# Patient Record
Sex: Female | Born: 1965 | Race: Black or African American | Hispanic: No | Marital: Single | Smoking: Current every day smoker
Health system: Southern US, Community
[De-identification: ages and names within clinical notes are randomized; demographics above are authoritative.]

## PROBLEM LIST (undated history)

## (undated) DIAGNOSIS — R011 Cardiac murmur, unspecified: Secondary | ICD-10-CM

## (undated) DIAGNOSIS — I480 Paroxysmal atrial fibrillation: Secondary | ICD-10-CM

## (undated) DIAGNOSIS — E785 Hyperlipidemia, unspecified: Secondary | ICD-10-CM

## (undated) DIAGNOSIS — I5030 Unspecified diastolic (congestive) heart failure: Secondary | ICD-10-CM

## (undated) DIAGNOSIS — J45909 Unspecified asthma, uncomplicated: Secondary | ICD-10-CM

## (undated) HISTORY — DX: Unspecified asthma, uncomplicated: J45.909

## (undated) HISTORY — DX: Hyperlipidemia, unspecified: E78.5

## (undated) HISTORY — PX: PARTIAL HYSTERECTOMY: SHX80

## (undated) HISTORY — PX: CYST EXCISION: SHX5701

## (undated) HISTORY — DX: Cardiac murmur, unspecified: R01.1

## (undated) HISTORY — DX: Paroxysmal atrial fibrillation: I48.0

## (undated) HISTORY — DX: Unspecified diastolic (congestive) heart failure: I50.30

---

## 2016-03-14 ENCOUNTER — Encounter: Payer: Self-pay | Admitting: Emergency Medicine

## 2016-03-14 ENCOUNTER — Emergency Department: Payer: Self-pay

## 2016-03-14 ENCOUNTER — Emergency Department
Admission: EM | Admit: 2016-03-14 | Discharge: 2016-03-14 | Disposition: A | Discharge status: Home / Self Care | Payer: Self-pay | Attending: Emergency Medicine | Admitting: Emergency Medicine

## 2016-03-14 DIAGNOSIS — R079 Chest pain, unspecified: Secondary | ICD-10-CM | POA: Insufficient documentation

## 2016-03-14 DIAGNOSIS — R0602 Shortness of breath: Secondary | ICD-10-CM | POA: Insufficient documentation

## 2016-03-14 DIAGNOSIS — J45909 Unspecified asthma, uncomplicated: Secondary | ICD-10-CM | POA: Insufficient documentation

## 2016-03-14 DIAGNOSIS — F17210 Nicotine dependence, cigarettes, uncomplicated: Secondary | ICD-10-CM | POA: Insufficient documentation

## 2016-03-14 LAB — CBC
HCT: 45.3 % (ref 35.0–47.0)
HEMOGLOBIN: 15.8 g/dL (ref 12.0–16.0)
MCH: 30.7 pg (ref 26.0–34.0)
MCHC: 34.9 g/dL (ref 32.0–36.0)
MCV: 88 fL (ref 80.0–100.0)
Platelets: 251 10*3/uL (ref 150–440)
RBC: 5.14 MIL/uL (ref 3.80–5.20)
RDW: 14.4 % (ref 11.5–14.5)
WBC: 8.6 10*3/uL (ref 3.6–11.0)

## 2016-03-14 LAB — TROPONIN I: Troponin I: <0.03 ng/mL (ref <0.03)

## 2016-03-14 LAB — BASIC METABOLIC PANEL
ANION GAP: 7 (ref 5–15)
BUN: 10 mg/dL (ref 6–20)
CALCIUM: 9.1 mg/dL (ref 8.9–10.3)
CHLORIDE: 105 mmol/L (ref 101–111)
CO2: 25 mmol/L (ref 22–32)
Creatinine, Ser: 0.66 mg/dL (ref 0.44–1.00)
GFR calc Af Amer: >60 mL/min (ref >60)
GFR calc non Af Amer: >60 mL/min (ref >60)
GLUCOSE: 112 mg/dL — AB (ref 65–99)
Potassium: 3.6 mmol/L (ref 3.5–5.1)
Sodium: 137 mmol/L (ref 135–145)

## 2016-03-14 IMAGING — CR DG CHEST 2V
1 series · 2 of 2 positions shown · non-contrast
Comparison: No recent prior.

CLINICAL DATA: Left-sided chest pain.

EXAM:
CHEST  2 VIEW

[Series 1: dg chest 2 view · 0.14mm/px · 2 of 2 slices shown]
[im 1/2]
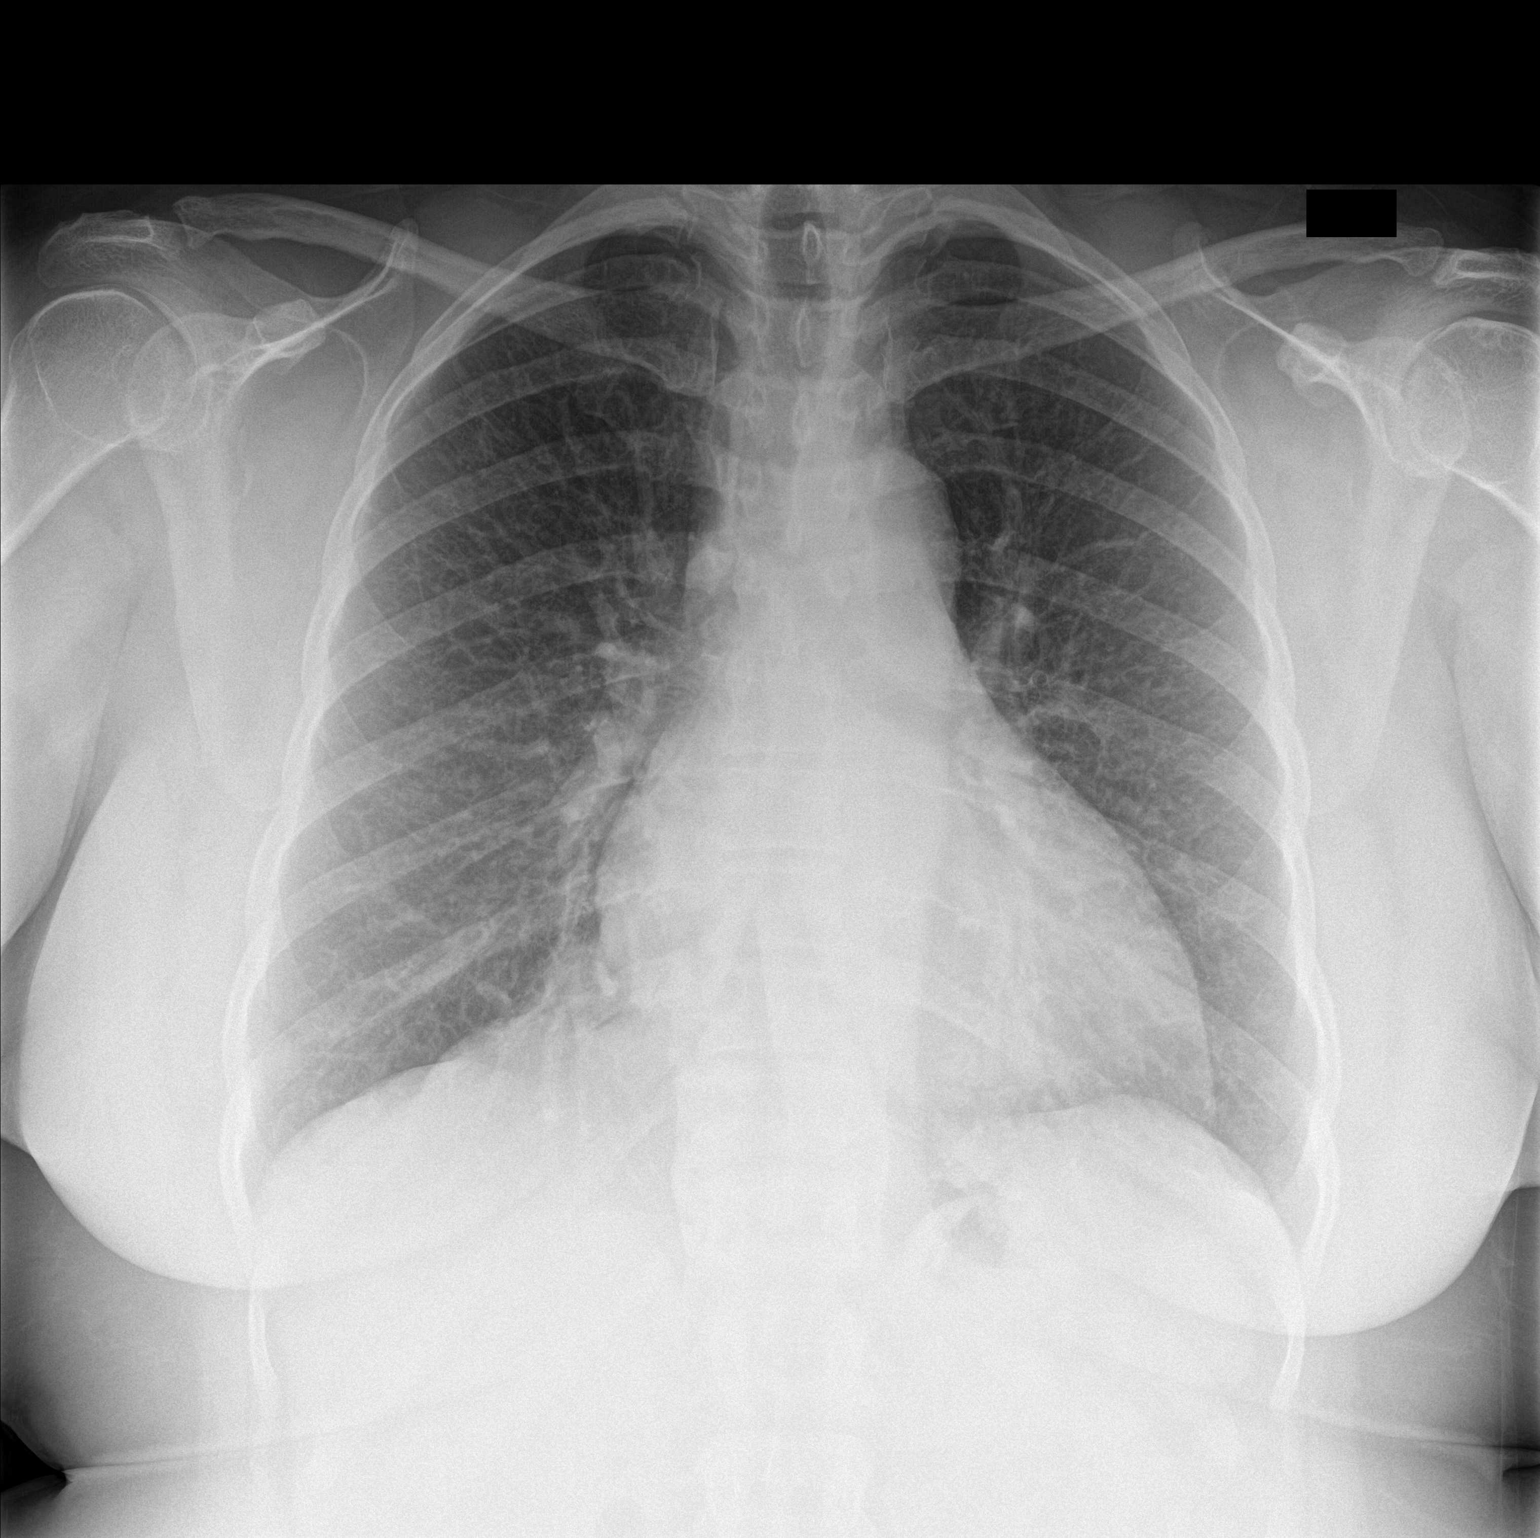
[im 2/2]
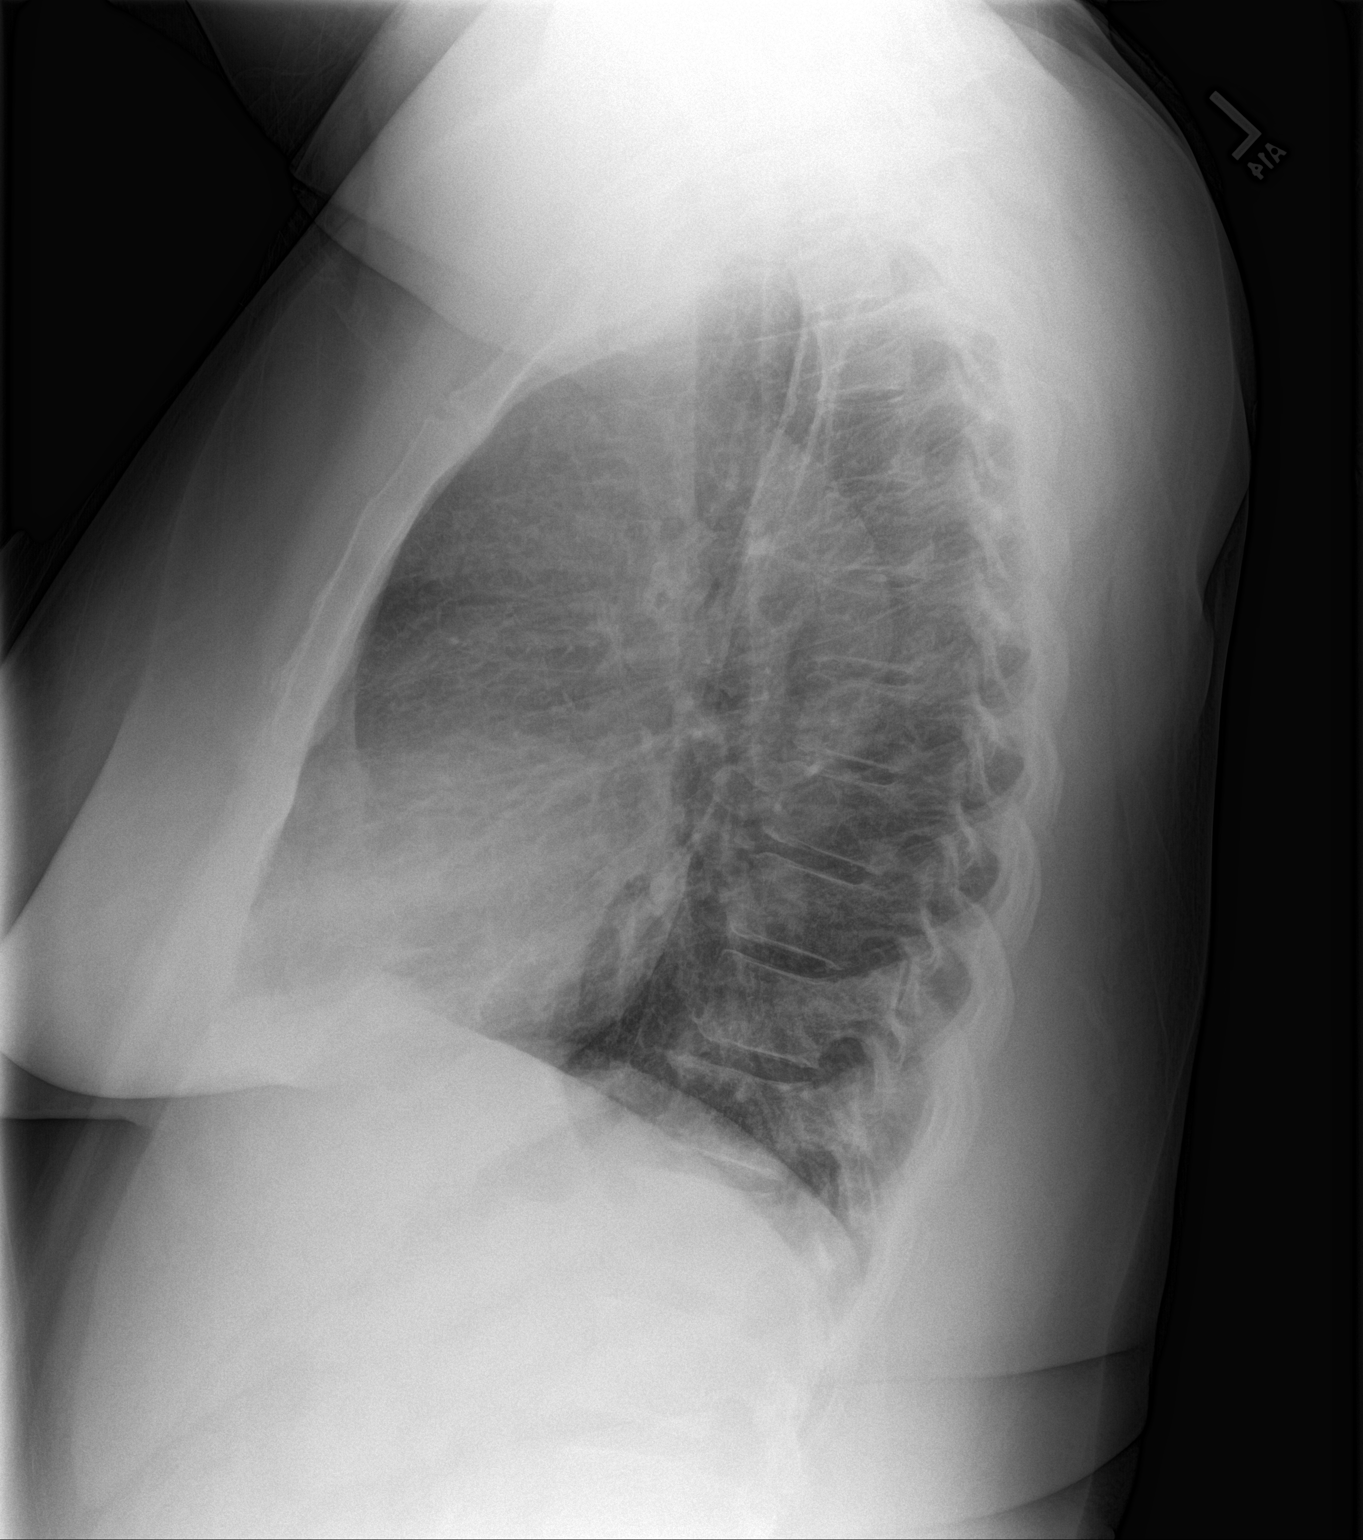

[2 of 2 positions shown; findings below may reference images not displayed]

FINDINGS: Mediastinum and hilar structures are normal. Mild left base
subsegmental atelectasis and or infiltrate.

Heart size normal.  No pleural effusion or pneumothorax.
IMPRESSION: Mild left base subsegmental atelectasis and or infiltrate.

## 2016-03-14 MED ORDER — ASPIRIN 81 MG PO CHEW
324.0000 mg | CHEWABLE_TABLET | Freq: Once | ORAL | Status: AC
  Administered 2016-03-14: 324 mg via ORAL
  Filled 2016-03-14: qty 4

## 2016-03-14 MED ORDER — ALBUTEROL SULFATE HFA 108 (90 BASE) MCG/ACT IN AERS: 2.0000 | INHALATION_SPRAY | Freq: Four times a day (QID) | RESPIRATORY_TRACT | 2 refills | Status: DC | PRN

## 2016-03-14 NOTE — ED Notes (Signed)
Pt resting in bed, watching TV, pt awake and alert in no acute distress

## 2016-03-14 NOTE — ED Notes (Signed)
EDP at bedside  

## 2016-03-14 NOTE — ED Triage Notes (Signed)
Pt to ed with c/o chest pain intermittently over the last 2 weeks.  Pt states sob, associated with chest pain, but denies n/v, denies weakness, denies dizziness.

## 2016-03-14 NOTE — Discharge Instructions (Signed)

## 2016-03-14 NOTE — ED Notes (Signed)
Patient was unhooked from monitor to ambulate to bathroom.

## 2016-03-14 NOTE — ED Provider Notes (Signed)
[facility] Emergency Department Provider Note  ____________________________________________  Time seen: Approximately 9:59 AM  I have reviewed the triage vital signs and the nursing notes.   HISTORY  Chief Complaint Chest Pain   HPI Rebekah Davenport is a 50 y.o. female with a history of a heart murmur, COPD, and current smoker who presents for evaluation of chest pain. Patient reports that she's been having intermittent episodes of chest pain every other day for the last 2 weeks. She reports that the pain is sharp, located in the left side of her chest, nonradiating, that can happened both at rest and exertion. She reports that the pain lasts 1 minute and resolves without intervention. Pain is associated with mild SOB. She denies fever, chills, cough, body aches, lightheadedness, nausea, vomiting, abdominal pain, palpitations. Patient denies personal or family history of ischemic heart disease. She reports that she hasn't seen a doctor in 15 years. Her last episode of pain was yesterday in church and she comes in today because she couldn't get appointment with her primary care doctor today. She has no pain at this time. Patient reports that she smokes one pack of cigarettes per month. No personal or family history of PE or DVT, the pain is not pleuritic, no leg pain or swelling, no hemoptysis, no history of exogenous hormones.  History reviewed. No pertinent past medical history.  There are no active problems to display for this patient.   History reviewed. No pertinent surgical history.  Prior to Admission medications   Not on File    Allergies Review of patient's allergies indicates no known allergies.  History reviewed. No pertinent family history.  Social History Social History  Substance Use Topics  . Smoking status: Current Every Day Smoker  . Smokeless tobacco: Never Used  . Alcohol use No    Review of Systems  Constitutional: Negative for  fever. Eyes: Negative for visual changes. ENT: Negative for sore throat. Cardiovascular: + chest pain. Respiratory: + shortness of breath. Gastrointestinal: Negative for abdominal pain, vomiting or diarrhea. Genitourinary: Negative for dysuria. Musculoskeletal: Negative for back pain. Skin: Negative for rash. Neurological: Negative for headaches, weakness or numbness.  ____________________________________________   PHYSICAL EXAM:  VITAL SIGNS: ED Triage Vitals  Enc Vitals Group     BP 03/14/16 0836 (!) 155/73     Pulse Rate 03/14/16 0836 64     Resp 03/14/16 0836 18     Temp 03/14/16 0836 97.9 F (36.6 C)     Temp Source 03/14/16 0836 Oral     SpO2 03/14/16 0836 100 %     Weight 03/14/16 0836 260 lb (117.9 kg)     Height 03/14/16 0836 5\' 5"  (1.651 m)     Head Circumference --      Peak Flow --      Pain Score 03/14/16 0837 3     Pain Loc --      Pain Edu? --      Excl. in GC? --     Constitutional: Alert and oriented. Well appearing and in no apparent distress. HEENT:      Head: Normocephalic and atraumatic.         Eyes: Conjunctivae are normal. Sclera is non-icteric. EOMI. PERRL      Mouth/Throat: Mucous membranes are moist.       Neck: Supple with no signs of meningismus. Cardiovascular: Regular rate and rhythm. No murmurs, gallops, or rubs. 2+ symmetrical distal pulses are present in all extremities. No  JVD. Respiratory: Normal respiratory effort. Lungs are clear to auscultation bilaterally. No wheezes, crackles, or rhonchi.  Gastrointestinal: Soft, non tender, and non distended with positive bowel sounds. No rebound or guarding. Genitourinary: No CVA tenderness. Musculoskeletal: Nontender with normal range of motion in all extremities. No edema, cyanosis, or erythema of extremities. Neurologic: Normal speech and language. Face is symmetric. Moving all extremities. No gross focal neurologic deficits are appreciated. Skin: Skin is warm, dry and intact. No rash  noted. Psychiatric: Mood and affect are normal. Speech and behavior are normal.  ____________________________________________   LABS (all labs ordered are listed, but only abnormal results are displayed)  Labs Reviewed  BASIC METABOLIC PANEL - Abnormal; Notable for the following:       Result Value   Glucose, Bld 112 (*)    All other components within normal limits  CBC  TROPONIN I  TROPONIN I   ____________________________________________  EKG  ED ECG REPORT I, Nita Sickle, the attending physician, personally viewed and interpreted this ECG.   Normal sinus rhythm, rate of 72, normal intervals, normal axis, no ST elevations or depressions. ____________________________________________  RADIOLOGY  CXR:  Mild left base subsegmental atelectasis and or infiltrate ____________________________________________   PROCEDURES  Procedure(s) performed: None Procedures Critical Care performed:  None ____________________________________________   INITIAL IMPRESSION / ASSESSMENT AND PLAN / ED COURSE   50 y.o. female with a history of a heart murmur, COPD, and current smoker who presents for evaluation of short lived, recurrent episodes of left-sided sharp chest pain. Episodes last less than 1 minute. Her last episode was yesterday while in church. Patient is asymptomatic at this time. EKG with no evidence of ischemia. First troponin is negative. Chest x-ray showing atelectasis and questionable infiltrate however patient has no signs or symptoms of pneumonia. Heart score of 2. Will give a full dose of aspirin, observe on telemetry, get 2 troponins every 3 hours. If everything is negative. We'll discharge patient with follow-up with cardiology for a stress test as outpatient.  Clinical Course  Comment By Time  Troponin 2 is negative. EKG nonischemic. Remaining of her workup essentially unremarkable. I have spoke with Dr. Kirke Corin, cardiology who will see patient this week in his  clinic for an outpatient stress test. I have discussed return precautions with patient if she has new or worsening chest pain prior to her appointment with cardiology and instructed her to return to the emergency room. Patient agrees with these recommendations. We'll discharge at this time. Nita Sickle, MD 09/18 1225    Pertinent labs & imaging results that were available during my care of the patient were reviewed by me and considered in my medical decision making (see chart for details).    ____________________________________________   FINAL CLINICAL IMPRESSION(S) / ED DIAGNOSES  Final diagnoses:  Chest pain, unspecified chest pain type      NEW MEDICATIONS STARTED DURING THIS VISIT:  New Prescriptions   No medications on file     Note:  This document was prepared using Dragon voice recognition software and may include unintentional dictation errors.    Nita Sickle, MD 03/14/16 1227

## 2016-03-16 ENCOUNTER — Ambulatory Visit (INDEPENDENT_AMBULATORY_CARE_PROVIDER_SITE_OTHER): Payer: Self-pay | Admitting: Internal Medicine

## 2016-03-16 ENCOUNTER — Encounter: Payer: Self-pay | Admitting: Internal Medicine

## 2016-03-16 VITALS — BP 140/76 | HR 61 | Ht 65.5 in | Wt 271.5 lb

## 2016-03-16 DIAGNOSIS — R079 Chest pain, unspecified: Secondary | ICD-10-CM

## 2016-03-16 MED ORDER — ACETAMINOPHEN 500 MG PO TABS: 500.0000 mg | ORAL_TABLET | Freq: Four times a day (QID) | ORAL | 0 refills | Status: DC | PRN

## 2016-03-16 NOTE — Patient Instructions (Addendum)
Medication Instructions:  Your physician has recommended you make the following change in your medication:  1. Start Aspirin 81 mg Once daily 2. Tylenol 500 mg every 6 hours as needed for pain   Testing/Procedures: [facility]  Your caregiver has ordered a Stress Test with nuclear imaging. The purpose of this test is to evaluate the blood supply to your heart muscle. This procedure is referred to as a "Non-Invasive Stress Test." This is because other than having an IV started in your vein, nothing is inserted or "invades" your body. Cardiac stress tests are done to find areas of poor blood flow to the heart by determining the extent of coronary artery disease (CAD). Some patients exercise on a treadmill, which naturally increases the blood flow to your heart, while others who are  unable to walk on a treadmill due to physical limitations have a pharmacologic/chemical stress agent called Lexiscan . This medicine will mimic walking on a treadmill by temporarily increasing your coronary blood flow.   Please note: these test may take anywhere between 2-4 hours to complete  PLE[facility] REPORT TO [facility]  THE VOLUNTEERS AT THE FIRST DESK WILL DIRECT YOU WHERE TO GO  Date of Procedure:__Monday March 28, 2016 at 09:00AM___  Arrival Time for Procedure:__Arrive at 08:45AM to register___    PLEASE NOTIFY THE OFFICE AT LEAST 24 HOURS IN ADVANCE IF YOU ARE UNABLE TO KEEP YOUR APPOINTMENT.  [phone] AND  PLEASE NOTIFY NUCLEAR MEDICINE AT [facility] AT LEAST 24 HOURS IN ADVANCE IF YOU ARE UNABLE TO KEEP YOUR APPOINTMENT. [phone]  How to prepare for your Myoview test:  1. Do not eat or drink after midnight 2. No caffeine for 24 hours prior to test 3. No smoking 24 hours prior to test. 4. Your medication may be taken with water.  If your doctor stopped a medication because of this test, do not take that medication. 5. Ladies, please do not wear dresses.  Skirts or pants are  appropriate. Please wear a short sleeve shirt. 6. No perfume, cologne or lotion. 7. Wear comfortable walking shoes. No heels!   Follow-Up: Your physician recommends that you schedule a follow-up appointment as needed. We will call you with results and if needed schedule follow up at that time.  It was a pleasure seeing you today here in the office. Please do not hesitate to give us a call back if you have any further questions. [phone]   CellarPamela A. RN, BSN     Cardiac Nuclear Scanning A cardiac nuclear scan is used to check your heart for problems, such as the following:  A portion of the heart is not getting enough blood.  Part of the heart muscle has died, which happens with a heart attack.  The heart wall is not working normally.  In this test, a radioactive dye (tracer) is injected into your bloodstream. After the tracer has traveled to your heart, a scanning device is used to measure how much of the tracer is absorbed by or distributed to various areas of your heart. LET [facility] KNOW ABOUT:  Any allergies you have.  All medicines you are taking, including vitamins, herbs, eye drops, creams, and over-the-counter medicines.  Previous problems you or members of your family have had with the use of anesthetics.  Any blood disorders you have.  Previous surgeries you have had.  Medical conditions you have.  RISKS AND COMPLICATIONS Generally, this is a safe procedure. However, as with any procedure, problems  can occur. Possible problems include:   Serious chest pain.  Rapid heartbeat.  Sensation of warmth in your chest. This usually passes quickly. BEFORE THE PROCEDURE Ask your health care provider about changing or stopping your regular medicines. PROCEDURE This procedure is usually done at a hospital and takes 2-4 hours.  An IV tube is inserted into one of your veins.  Your health care provider will inject a small amount of radioactive tracer  through the tube.  You will then wait for 20-40 minutes while the tracer travels through your bloodstream.  You will lie down on an exam table so images of your heart can be taken. Images will be taken for about 15-20 minutes.  You will exercise on a treadmill or stationary bike. While you exercise, your heart activity will be monitored with an electrocardiogram (ECG), and your blood pressure will be checked.  If you are unable to exercise, you may be given a medicine to make your heart beat faster.  When blood flow to your heart has peaked, tracer will again be injected through the IV tube.  After 20-40 minutes, you will get back on the exam table and have more images taken of your heart.  When the procedure is over, your IV tube will be removed. AFTER THE PROCEDURE  You will likely be able to leave shortly after the test. Unless your health care provider tells you otherwise, you may return to your normal schedule, including diet, activities, and medicines.  Make sure you find out how and when you will get your test results.   This information is not intended to replace advice given to you by your health care provider. Make sure you discuss any questions you have with your health care provider.   Document Released: 07/08/2004 Document Revised: 06/18/2013 Document Reviewed: 05/22/2013 Elsevier Interactive Patient Education [employer]

## 2016-03-16 NOTE — Progress Notes (Signed)
New Outpatient Visit Date: 03/16/2016  Referring Provider: Nita Sicklearolina Veronese, MD  Chief Complaint:  Chief Complaint  Patient presents with  . other    [facility]; chest pain. Meds reviewed by the patient verbally. Pt. c/o chest pain with shortness of breath and sharp shooting pain in head.     HPI:  [name] is a [age] y.o. year-old female with history of obstructive lung disease and heart murmur, who has been [facility] for evaluation of chest pain. The patient presented on 03/14/16 with a two-week history of intermittent chest pain. She describes a sharp, severe pain under the left breast that lasts anywhere from one minute to one hour. It occurs randomly about once an hour and improves with aspirin. She denies radiation of the pain. It is not exertional and most often occurs at rest. She denies worsening of the pain with position and deep inspiration. She notes some associated shortness of breath but denies nausea and diaphoresis. She does not know of any precipitating factors including new activities, trauma to the chest, or medication changes. In the emergency department, serial troponins and EKG were negative. Chest x-ray was notable for mild left basilar atelectasis versus infiltrate.  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 She does not know further details.  [name] states that she was under the impression that she would have a stress test today.  The patient also endorses sharp left-sided headaches over the course of the last week. These last a split second and occur independent of her chest pain. She also had one episode of numbness involving the left arm that lasted for about 5 minutes. This has not recurred.  --------------------------------------------------------------------------------------------------  Past Medical History:  Diagnosis Date  . Asthma   . Heart murmur      Past Surgical History:  Procedure Laterality Date  . CYST EXCISION     upper back  . PARTIAL HYSTERECTOMY      Outpatient Encounter Prescriptions as of 03/16/2016  Medication Sig  . aspirin EC 81 MG tablet Take 81 mg by mouth daily.  Marland Kitchen. acetaminophen (TYLENOL) 500 MG tablet Take 1 tablet (500 mg total) by mouth every 6 (six) hours as needed.  Marland Kitchen. albuterol (PROVENTIL HFA;VENTOLIN HFA) 108 (90 Base) MCG/ACT inhaler Inhale 2 puffs into the lungs every 6 (six) hours as needed for wheezing or shortness of breath. (Patient not taking: Reported on 03/16/2016)   No facility-administered encounter medications on file as of 03/16/2016.     Allergies: Review of patient's allergies indicates no known allergies.  Social History   Social History  . Marital status: Single    Spouse name: N/A  . Number of children: N/A  . Years of education: N/A   Occupational History  . Not on file.   Social History Main Topics  . Smoking status: Current Every Day Smoker    Packs/day: 0.25    Years: 35.00  . Smokeless tobacco: Never Used  . Alcohol use No  . Drug use: No  . Sexual activity: Not on file   Other Topics Concern  . Not on file   Social History Narrative  . No narrative on file    Family History  Problem Relation Age of Onset  . Cancer - Cervical Mother   . Hyperlipidemia Father   . Hypertension Father     Review of Systems: A 12-system  review of systems was performed and was negative except as noted in the HPI.  --------------------------------------------------------------------------------------------------  Physical Exam: BP 140/76 (BP Location: Right Arm, Patient Position: Sitting, Cuff Size: Large)   Pulse 61   Ht 5' 5.5" (1.664 m)   Wt 271 lb 8 oz (123.2 kg)   SpO2 98%   BMI 44.49 kg/m   General:  Obese woman seated in the exam room. She appears upset. HEENT: No conjunctival pallor or scleral icterus.  Moist mucous membranes.  OP clear. Neck: Supple without  lymphadenopathy, thyromegaly, JVD, or HJR.  No carotid bruit. Lungs: Normal work of breathing.  Clear to auscultation bilaterally without wheezes or crackles. CV: Distant heart sounds. Regular rate and rhythm without murmurs, rubs, or gallops.  Unable to appreciate PMI due to body habitus. Abd: Bowel sounds present. Soft, nontender, nondistended. Unable to assess hepatosplenomegaly due to body habitus. Ext: No lower extremity edema.  Radial, PT, and DP pulses are 2+ bilaterally Skin: warm and dry without rash Neuro: CNIII-XII intact.  Strength and fine-touch sensation intact in upper and lower extremities bilaterally. Psych: Patient has a labile affect and at times is confrontational. She also becomes tearful during the exam, stating that she doesn't understand why she needs to be scheduled for a stress test given that she has continued to have pain.  EKG:  Sinus bradycardia (heart rate 53 bpm) with sinus arrhythmia. No significant abnormalities.  Lab Results  Component Value Date   WBC 8.6 03/14/2016   HGB 15.8 03/14/2016   HCT 45.3 03/14/2016   MCV 88.0 03/14/2016   PLT 251 03/14/2016    Lab Results  Component Value Date   NA 137 03/14/2016   K 3.6 03/14/2016   CL 105 03/14/2016   CO2 25 03/14/2016   BUN 10 03/14/2016   CREATININE 0.66 03/14/2016   GLUCOSE 112 (H) 03/14/2016   --------------------------------------------------------------------------------------------------  ASSESSMENT AND PLAN: 1. Chest pain, unspecified chest pain type Patient reports 2 weeks of atypical chest pain. Chest pain was intermittently present during today's visit, though exam and he are largely unrevealing. I suspect her pain is most likely musculoskeletal; her cardiac risk factors include continued smoking and obesity. We discussed further evaluation options, including myocardial perfusion stress test, echocardiogram (given her history of heart murmur and "hole in the heart"), and lipid panel for  risk stratification. The patient became upset at the suggestion of scheduling several tests in the near future, demanding that something be done today. I recommended that she consider taking low-dose aspirin (as this seemed to give her some symptomatic relief at home) and prn acetaminophen for her pain. She refused further testing aside from a stress test. We will therefore arrange for her to have an exercise myocardial perfusion stress test. I will follow-up the results with the patient by phone and schedule her to see me back in clinic if needed. I also encouraged the patient to stop smoking, though she was not receptive to this.  Follow-up: Will review stress test results with the patient by phone. Further clinic follow-up based on results of stress test.  Yvonne Kendall, MD 03/16/2016 1:01 PM

## 2016-03-25 ENCOUNTER — Telehealth: Payer: Self-pay | Admitting: Internal Medicine

## 2016-03-25 NOTE — Telephone Encounter (Signed)
Spoke with patient and reviewed instructions, date, time, and location for stress test scheduled on Monday. She verbalized understanding and had no further questions at this time.

## 2016-03-28 ENCOUNTER — Encounter
Admission: RE | Admit: 2016-03-28 | Discharge: 2016-03-28 | Disposition: A | Discharge status: Home / Self Care | Payer: Self-pay | Source: Ambulatory Visit | Attending: Internal Medicine | Admitting: Internal Medicine

## 2016-03-28 DIAGNOSIS — R079 Chest pain, unspecified: Secondary | ICD-10-CM | POA: Insufficient documentation

## 2016-03-28 DIAGNOSIS — I10 Essential (primary) hypertension: Secondary | ICD-10-CM | POA: Insufficient documentation

## 2016-03-28 LAB — NM MYOCAR MULTI W/SPECT W/WALL MOTION / EF
CHL CUP NUCLEAR SSS: 0
CHL CUP STRESS STAGE 1 SPEED: 0 mph
CHL CUP STRESS STAGE 2 GRADE: 0 %
CHL CUP STRESS STAGE 2 HR: 72 {beats}/min
CHL CUP STRESS STAGE 2 SPEED: 0 mph
CHL CUP STRESS STAGE 3 SPEED: 1.7 mph
CHL CUP STRESS STAGE 4 SPEED: 2.5 mph
CHL CUP STRESS STAGE 5 GRADE: 0 %
CHL CUP STRESS STAGE 5 HR: 129 {beats}/min
CHL CUP STRESS STAGE 5 SPEED: 0 mph
CHL CUP STRESS STAGE 6 HR: 78 {beats}/min
CHL CUP STRESS STAGE 6 SBP: 139 mmHg
CSEPEW: 7 METS
CSEPPMHR: 89 %
Exercise duration (min): 5 min
Exercise duration (sec): 45 s
LV sys vol: 35 mL
LVDIAVOL: 77 mL (ref 46–106)
Peak HR: 150 {beats}/min
Percent HR: 89 %
Rest HR: 54 {beats}/min
SDS: 0
SRS: 2
Stage 1 Grade: 0 %
Stage 1 HR: 72 {beats}/min
Stage 3 Grade: 10 %
Stage 3 HR: 123 {beats}/min
Stage 4 Grade: 12 %
Stage 4 HR: 150 {beats}/min
Stage 6 DBP: 81 mmHg
Stage 6 Grade: 0 %
Stage 6 Speed: 0 mph
TID: 0.9

## 2016-03-28 MED ORDER — TECHNETIUM TC 99M TETROFOSMIN IV KIT
13.0880 | PACK | Freq: Once | INTRAVENOUS | Status: AC | PRN
  Administered 2016-03-28: 13.088 via INTRAVENOUS

## 2016-03-28 MED ORDER — TECHNETIUM TC 99M TETROFOSMIN IV KIT
32.7200 | PACK | Freq: Once | INTRAVENOUS | Status: AC | PRN
  Administered 2016-03-28: 32.72 via INTRAVENOUS

## 2016-03-29 ENCOUNTER — Telehealth: Payer: Self-pay | Admitting: Internal Medicine

## 2016-03-29 NOTE — Telephone Encounter (Signed)
Patient called in wanting to know stress test results. Let her know that results are pending Dr. Serita KyleEnd's review. Let her know that we would give her a call with results once he has reviewed them. She verbalized understanding and had no further questions.

## 2016-03-29 NOTE — Telephone Encounter (Signed)
Pt would like stress test results. States she is still having chest pain and heart flutters. States she had CP this morining that lasted for about a minute or so. States she is also SOB.

## 2016-03-30 NOTE — Telephone Encounter (Signed)
Reviewed results of stress test with patient and she verbalized understanding. She reports that she continues to have chest pain and has been up all night with it. Offered her a follow up appointment and she was very upset stating what did she need that for if the test didn't show anything. Explained to her that Dr. Okey DupreEnd could discuss her symptoms and other treatment options. Offered to schedule her a follow up and she finally agreed. Schedulers haven't arrived so let her know that someone would call her to schedule this appointment. She then started saying that she needs inhaler samples because she can't afford them and started to curse when I told her we didn't carry samples of those here. She wanted us to prescribe something cheaper and let her know that we don't prescribe inhalers here and she abruptly hung up the phone. Will forward this to Dr. Okey DupreEnd and director for their review.

## 2016-03-30 NOTE — Telephone Encounter (Signed)
Please let Rebekah Davenport know that her stress test is low risk and did not show any abnormalities.  If she continues to have symptoms, she should schedule a follow-up to discuss further evaluation and treatment options.  Thanks.

## 2016-03-31 NOTE — Telephone Encounter (Signed)
I spoke with the patient to reassess her symptoms.  She continues to have intermittent pain under the left breath with exertion and at rest.  No clear precipitants.  Stress test was reassuring without evidence of ischemia.  LVEF was normal.  We discussed potential medical therapy to address her symptoms, but the patient states that she cannot afford any medication and does not wish to start anything.  She has not been able to pick up the albuterol inhaler prescribed by the ED due to cost.  She is in the process of applying for Medicaid but has yet to hear anything back.  We will follow-up in clinic next week as scheduled.  Yvonne Kendallhristopher Gracen Ringwald, MD [facility] Pager: [phone]

## 2016-04-05 ENCOUNTER — Ambulatory Visit: Payer: Self-pay | Admitting: Internal Medicine

## 2016-04-05 ENCOUNTER — Encounter: Payer: Self-pay | Admitting: *Deleted

## 2016-04-05 NOTE — Progress Notes (Deleted)
Follow-up Outpatient Visit Date: 04/05/2016  Chief Complaint: Chest pain  HPI:  Rebekah Davenport is a 50 y.o. year-old female with history of obstructive lung disease and heart murmur, who presents for follow-up of chest pain. I first met the patient on 03/16/16 following a recent ED visit for atypical chest pain. She had undergone serial troponins, which were negative. We agreed to obtain a myocardial perfusion stress test, which showed no evidence of ischemia. LVEF was 67%. The patient was able to achieve 7 minutes on the treadmill without diagnostic EKG changes. We discussed results of her stress test by phone, which time the patient reported continued chest pain. She was not interested in additional pharmacologic therapy at this time due to cost constraints.  ***  --------------------------------------------------------------------------------------------------  Cardiovascular History & Procedures: Cardiovascular Problems:  Atypical chest pain  "Hole in my heart) as a child (details unknown)  Risk Factors:  Obesity  Cath/PCI:  None.  CV Surgery:  None.  EP Procedures and Devices:  None.  Non-Invasive Evaluation(s):  Exercise myocardial perfusion stress test (03/28/16): No significant ischemia. LVEF 67%. No EKG changes concerning for ischemia at peak stress or in recovery. Patient exercised 5:45 minutes (7 METs). Low risk study.  Recent CV Pertinent Labs: Lab Results  Component Value Date   K 3.6 03/14/2016   BUN 10 03/14/2016   CREATININE 0.66 03/14/2016   Past medical and surgical history were reviewed and updated in EPIC.   Outpatient Encounter Prescriptions as of 04/05/2016  Medication Sig  . acetaminophen (TYLENOL) 500 MG tablet Take 1 tablet (500 mg total) by mouth every 6 (six) hours as needed.  Marland Kitchen albuterol (PROVENTIL HFA;VENTOLIN HFA) 108 (90 Base) MCG/ACT inhaler Inhale 2 puffs into the lungs every 6 (six) hours as needed for wheezing or shortness of breath.  (Patient not taking: Reported on 03/16/2016)  . aspirin EC 81 MG tablet Take 81 mg by mouth daily.   No facility-administered encounter medications on file as of 04/05/2016.     Allergies: Review of patient's allergies indicates no known allergies.  Social History   Social History  . Marital status: Single    Spouse name: N/A  . Number of children: N/A  . Years of education: N/A   Occupational History  . Not on file.   Social History Main Topics  . Smoking status: Current Every Day Smoker    Packs/day: 0.25    Years: 35.00  . Smokeless tobacco: Never Used  . Alcohol use No  . Drug use: No  . Sexual activity: Not on file   Other Topics Concern  . Not on file   Social History Narrative  . No narrative on file    Family History  Problem Relation Age of Onset  . Cancer - Cervical Mother   . Hyperlipidemia Father   . Hypertension Father     Review of Systems: A 12-system review of systems was performed and was negative except as noted in the HPI.  --------------------------------------------------------------------------------------------------  Physical Exam: There were no vitals taken for this visit.  General:  *** HEENT: No conjunctival pallor or scleral icterus.  Moist mucous membranes.  OP clear. Neck: Supple without lymphadenopathy, thyromegaly, JVD, or HJR.  No carotid bruit. Lungs: Normal work of breathing.  Clear to auscultation bilaterally without wheezes or crackles. Heart: Regular rate and rhythm without murmurs, rubs, or gallops.  Non-displaced PMI. Abd: Bowel sounds present.  Soft, NT/ND without hepatosplenomegaly Ext: No lower extremity edema.  Radial, PT, and DP pulses  are 2+ bilaterally. Skin: warm and dry without rash  EKG:  ***  Lab Results  Component Value Date   WBC 8.6 03/14/2016   HGB 15.8 03/14/2016   HCT 45.3 03/14/2016   MCV 88.0 03/14/2016   PLT 251 03/14/2016    Lab Results  Component Value Date   NA 137 03/14/2016   K 3.6  03/14/2016   CL 105 03/14/2016   CO2 25 03/14/2016   BUN 10 03/14/2016   CREATININE 0.66 03/14/2016   GLUCOSE 112 (H) 03/14/2016    No results found for: CHOL, HDL, LDLCALC, LDLDIRECT, TRIG, CHOLHDL  --------------------------------------------------------------------------------------------------  ASSESSMENT AND PLAN: ***  Nelva Bush, MD 04/05/2016 8:18 AM

## 2017-09-15 ENCOUNTER — Encounter: Payer: Self-pay | Admitting: *Deleted

## 2017-09-15 ENCOUNTER — Other Ambulatory Visit: Payer: Self-pay

## 2017-09-15 ENCOUNTER — Ambulatory Visit
Admission: EM | Admit: 2017-09-15 | Discharge: 2017-09-15 | Disposition: A | Discharge status: Home / Self Care | Payer: Self-pay | Attending: Family Medicine | Admitting: Family Medicine

## 2017-09-15 DIAGNOSIS — J40 Bronchitis, not specified as acute or chronic: Secondary | ICD-10-CM

## 2017-09-15 MED ORDER — PREDNISONE 20 MG PO TABS: ORAL_TABLET | ORAL | 0 refills | Status: DC

## 2017-09-15 MED ORDER — DOXYCYCLINE HYCLATE 100 MG PO TABS: 100.0000 mg | ORAL_TABLET | Freq: Two times a day (BID) | ORAL | 0 refills | Status: DC

## 2017-09-15 MED ORDER — ALBUTEROL SULFATE HFA 108 (90 BASE) MCG/ACT IN AERS: 1.0000 | INHALATION_SPRAY | Freq: Four times a day (QID) | RESPIRATORY_TRACT | 0 refills | Status: DC | PRN

## 2017-09-15 NOTE — ED Triage Notes (Signed)
Patient started having symptoms of cough, chest congestion, and nasal drainage 3 weeks ago. Patient reports recently being unable to sleep.

## 2017-09-15 NOTE — ED Provider Notes (Signed)
[facility]    CSN: [id number] Arrival date & time: 09/15/17  4098     History   Chief Complaint Chief Complaint  Patient presents with  . Cough  . Nasal Congestion    HPI Rebekah Davenport is a 52 y.o. female.   The history is provided by the patient.  Cough  Associated symptoms: wheezing   URI  Presenting symptoms: congestion and cough   Severity:  Moderate Onset quality:  Sudden Duration:  1 month Timing:  Constant Progression:  Worsening Chronicity:  New Relieved by:  None tried Ineffective treatments:  None tried Associated symptoms: sinus pain and wheezing   Risk factors: chronic respiratory disease   Risk factors: not elderly, no chronic cardiac disease, no chronic kidney disease, no diabetes mellitus, no immunosuppression, no recent illness, no recent travel and no sick contacts     Past Medical History:  Diagnosis Date  . Asthma   . Heart murmur     There are no active problems to display for this patient.   Past Surgical History:  Procedure Laterality Date  . CYST EXCISION     upper back  . PARTIAL HYSTERECTOMY      OB History   None      Home Medications    Prior to Admission medications   Medication Sig Start Date End Date Taking? Authorizing Provider  acetaminophen (TYLENOL) 500 MG tablet Take 1 tablet (500 mg total) by mouth every 6 (six) hours as needed. 03/16/16   End, Cristal Deer, MD  albuterol (PROVENTIL HFA;VENTOLIN HFA) 108 (90 Base) MCG/ACT inhaler Inhale 1-2 puffs into the lungs every 6 (six) hours as needed for wheezing or shortness of breath. 09/15/17   Payton Mccallum, MD  aspirin EC 81 MG tablet Take 81 mg by mouth daily.    [provider]  doxycycline (VIBRA-TABS) 100 MG tablet Take 1 tablet (100 mg total) by mouth 2 (two) times daily. 09/15/17   Payton Mccallum, MD  predniSONE (DELTASONE) 20 MG tablet 3 tabs po once day 1, then 2 tabs po qd x 2 days, then 1 tab po qd x 2 days, then half a tab po qd x 2  days 09/15/17   Payton Mccallum, MD    Family History Family History  Problem Relation Age of Onset  . Cancer - Cervical Mother   . Hyperlipidemia Father   . Hypertension Father     Social History Social History   Tobacco Use  . Smoking status: Current Every Day Smoker    Packs/day: 0.50    Years: 35.00    Pack years: 17.50    Types: Cigarettes  . Smokeless tobacco: Never Used  Substance Use Topics  . Alcohol use: No  . Drug use: No     Allergies   Patient has no known allergies.   Review of Systems Review of Systems  HENT: Positive for congestion and sinus pain.   Respiratory: Positive for cough and wheezing.      Physical Exam Triage Vital Signs ED Triage Vitals  Enc Vitals Group     BP 09/15/17 1029 (!) 159/84     Pulse Rate 09/15/17 1029 66     Resp 09/15/17 1029 18     Temp 09/15/17 1029 97.7 F (36.5 C)     Temp Source 09/15/17 1029 Oral     SpO2 09/15/17 1029 99 %     Weight 09/15/17 1031 275 lb (124.7 kg)     Height 09/15/17 1031 5'  5.5" (1.664 m)     Head Circumference --      Peak Flow --      Pain Score 09/15/17 1030 0     Pain Loc --      Pain Edu? --      Excl. in GC? --    No data found.  Updated Vital Signs BP (!) 159/84 (BP Location: Left Arm)   Pulse 66   Temp 97.7 F (36.5 C) (Oral)   Resp 18   Ht 5' 5.5" (1.664 m)   Wt 275 lb (124.7 kg)   SpO2 99%   BMI 45.07 kg/m   Visual Acuity Right Eye Distance:   Left Eye Distance:   Bilateral Distance:    Right Eye Near:   Left Eye Near:    Bilateral Near:     Physical Exam  Constitutional: She appears well-developed and well-nourished. No distress.  HENT:  Head: Normocephalic and atraumatic.  Right Ear: Tympanic membrane, external ear and ear canal normal.  Left Ear: Tympanic membrane, external ear and ear canal normal.  Nose: Rhinorrhea present. No mucosal edema, nose lacerations, sinus tenderness, nasal deformity, septal deviation or nasal septal hematoma. No epistaxis.   No foreign bodies. Right sinus exhibits no maxillary sinus tenderness and no frontal sinus tenderness. Left sinus exhibits no maxillary sinus tenderness and no frontal sinus tenderness.  Mouth/Throat: Uvula is midline and mucous membranes are normal. No oropharyngeal exudate, posterior oropharyngeal edema or posterior oropharyngeal erythema. No tonsillar exudate.  Eyes: Pupils are equal, round, and reactive to light. Conjunctivae and EOM are normal. Right eye exhibits no discharge. Left eye exhibits no discharge. No scleral icterus.  Neck: Normal range of motion. Neck supple. No thyromegaly present.  Cardiovascular: Normal rate, regular rhythm and normal heart sounds.  Pulmonary/Chest: Effort normal. No stridor. No respiratory distress. She has wheezes (diffusely and rhonchi). She has no rales.  Lymphadenopathy:    She has no cervical adenopathy.  Skin: She is not diaphoretic.  Nursing note and vitals reviewed.    UC Treatments / Results  Labs (all labs ordered are listed, but only abnormal results are displayed) Labs Reviewed - No data to display  EKG  EKG Interpretation None       Radiology No results found.  Procedures Procedures (including critical care time)  Medications Ordered in UC Medications - No data to display   Initial Impression / Assessment and Plan / UC Course  I have reviewed the triage vital signs and the nursing notes.  Pertinent labs & imaging results that were available during my care of the patient were reviewed by me and considered in my medical decision making (see chart for details).       Final Clinical Impressions(s) / UC Diagnoses   Final diagnoses:  Bronchitis    ED Discharge Orders        Ordered    doxycycline (VIBRA-TABS) 100 MG tablet  2 times daily     09/15/17 1122    predniSONE (DELTASONE) 20 MG tablet     09/15/17 1122    albuterol (PROVENTIL HFA;VENTOLIN HFA) 108 (90 Base) MCG/ACT inhaler  Every 6 hours PRN     09/15/17  1122     1. diagnosis reviewed with patient 2. rx as per orders above; reviewed possible side effects, interactions, risks and benefits  3. Recommend supportive treatment with rest, fluids  4. Follow-up prn if symptoms worsen or don't improve  Controlled Substance Prescriptions [place] Controlled Substance Registry consulted? Not  Cheron Schaumann, MD 09/15/17 [phone]

## 2019-08-18 ENCOUNTER — Ambulatory Visit: Payer: MEDICAID | Attending: Internal Medicine

## 2020-04-13 ENCOUNTER — Other Ambulatory Visit: Payer: Self-pay

## 2020-04-13 ENCOUNTER — Encounter: Payer: Self-pay | Admitting: Emergency Medicine

## 2020-04-13 ENCOUNTER — Emergency Department: Payer: Self-pay

## 2020-04-13 ENCOUNTER — Inpatient Hospital Stay
Admission: EM | Admit: 2020-04-13 | Discharge: 2020-04-15 | DRG: 064 | Disposition: A | Discharge status: Home / Self Care | Payer: Self-pay | Attending: Internal Medicine | Admitting: Internal Medicine

## 2020-04-13 DIAGNOSIS — J449 Chronic obstructive pulmonary disease, unspecified: Secondary | ICD-10-CM | POA: Diagnosis present

## 2020-04-13 DIAGNOSIS — R32 Unspecified urinary incontinence: Secondary | ICD-10-CM | POA: Diagnosis present

## 2020-04-13 DIAGNOSIS — Z825 Family history of asthma and other chronic lower respiratory diseases: Secondary | ICD-10-CM

## 2020-04-13 DIAGNOSIS — R269 Unspecified abnormalities of gait and mobility: Secondary | ICD-10-CM | POA: Diagnosis present

## 2020-04-13 DIAGNOSIS — Z6841 Body Mass Index (BMI) 40.0 and over, adult: Secondary | ICD-10-CM

## 2020-04-13 DIAGNOSIS — I672 Cerebral atherosclerosis: Secondary | ICD-10-CM | POA: Diagnosis present

## 2020-04-13 DIAGNOSIS — E785 Hyperlipidemia, unspecified: Secondary | ICD-10-CM | POA: Diagnosis present

## 2020-04-13 DIAGNOSIS — G9341 Metabolic encephalopathy: Secondary | ICD-10-CM | POA: Diagnosis present

## 2020-04-13 DIAGNOSIS — R29700 NIHSS score 0: Secondary | ICD-10-CM | POA: Diagnosis present

## 2020-04-13 DIAGNOSIS — Z7982 Long term (current) use of aspirin: Secondary | ICD-10-CM

## 2020-04-13 DIAGNOSIS — G9340 Encephalopathy, unspecified: Secondary | ICD-10-CM | POA: Diagnosis present

## 2020-04-13 DIAGNOSIS — R413 Other amnesia: Secondary | ICD-10-CM | POA: Diagnosis present

## 2020-04-13 DIAGNOSIS — I639 Cerebral infarction, unspecified: Secondary | ICD-10-CM

## 2020-04-13 DIAGNOSIS — I63521 Cerebral infarction due to unspecified occlusion or stenosis of right anterior cerebral artery: Principal | ICD-10-CM | POA: Diagnosis present

## 2020-04-13 DIAGNOSIS — Z79899 Other long term (current) drug therapy: Secondary | ICD-10-CM

## 2020-04-13 DIAGNOSIS — Z83438 Family history of other disorder of lipoprotein metabolism and other lipidemia: Secondary | ICD-10-CM

## 2020-04-13 DIAGNOSIS — I6782 Cerebral ischemia: Secondary | ICD-10-CM | POA: Diagnosis present

## 2020-04-13 DIAGNOSIS — R410 Disorientation, unspecified: Secondary | ICD-10-CM

## 2020-04-13 DIAGNOSIS — I6389 Other cerebral infarction: Secondary | ICD-10-CM

## 2020-04-13 DIAGNOSIS — Z823 Family history of stroke: Secondary | ICD-10-CM

## 2020-04-13 DIAGNOSIS — Z90711 Acquired absence of uterus with remaining cervical stump: Secondary | ICD-10-CM

## 2020-04-13 DIAGNOSIS — Z20822 Contact with and (suspected) exposure to covid-19: Secondary | ICD-10-CM | POA: Diagnosis present

## 2020-04-13 DIAGNOSIS — F17210 Nicotine dependence, cigarettes, uncomplicated: Secondary | ICD-10-CM | POA: Diagnosis present

## 2020-04-13 LAB — CBC WITH DIFFERENTIAL/PLATELET
Abs Immature Granulocytes: 0.01 10*3/uL (ref 0.00–0.07)
Basophils Absolute: 0.1 10*3/uL (ref 0.0–0.1)
Basophils Relative: 1 %
Eosinophils Absolute: 0.2 10*3/uL (ref 0.0–0.5)
Eosinophils Relative: 3 %
HCT: 45.5 % (ref 36.0–46.0)
Hemoglobin: 15.4 g/dL — ABNORMAL HIGH (ref 12.0–15.0)
Immature Granulocytes: 0 %
Lymphocytes Relative: 32 %
Lymphs Abs: 2.8 10*3/uL (ref 0.7–4.0)
MCH: 30 pg (ref 26.0–34.0)
MCHC: 33.8 g/dL (ref 30.0–36.0)
MCV: 88.7 fL (ref 80.0–100.0)
Monocytes Absolute: 0.8 10*3/uL (ref 0.1–1.0)
Monocytes Relative: 9 %
Neutro Abs: 4.8 10*3/uL (ref 1.7–7.7)
Neutrophils Relative %: 55 %
Platelets: 281 10*3/uL (ref 150–400)
RBC: 5.13 MIL/uL — ABNORMAL HIGH (ref 3.87–5.11)
RDW: 13.9 % (ref 11.5–15.5)
WBC: 8.6 10*3/uL (ref 4.0–10.5)
nRBC: 0 % (ref 0.0–0.2)

## 2020-04-13 LAB — COMPREHENSIVE METABOLIC PANEL
ALT: 14 U/L (ref 0–44)
AST: 20 U/L (ref 15–41)
Albumin: 3.9 g/dL (ref 3.5–5.0)
Alkaline Phosphatase: 58 U/L (ref 38–126)
Anion gap: 7 (ref 5–15)
BUN: 12 mg/dL (ref 6–20)
CO2: 26 mmol/L (ref 22–32)
Calcium: 8.9 mg/dL (ref 8.9–10.3)
Chloride: 106 mmol/L (ref 98–111)
Creatinine, Ser: 0.98 mg/dL (ref 0.44–1.00)
GFR, Estimated: >60 mL/min (ref >60)
Glucose, Bld: 106 mg/dL — ABNORMAL HIGH (ref 70–99)
Potassium: 3.6 mmol/L (ref 3.5–5.1)
Sodium: 139 mmol/L (ref 135–145)
Total Bilirubin: 0.6 mg/dL (ref 0.3–1.2)
Total Protein: 7.4 g/dL (ref 6.5–8.1)

## 2020-04-13 LAB — URINALYSIS, COMPLETE (UACMP) WITH MICROSCOPIC
Bacteria, UA: NONE SEEN
Bilirubin Urine: NEGATIVE
Glucose, UA: NEGATIVE mg/dL
Ketones, ur: NEGATIVE mg/dL
Leukocytes,Ua: NEGATIVE
Nitrite: NEGATIVE
Protein, ur: NEGATIVE mg/dL
Specific Gravity, Urine: 1.026 (ref 1.005–1.030)
pH: 5 (ref 5.0–8.0)

## 2020-04-13 LAB — URINE DRUG SCREEN, QUALITATIVE (ARMC ONLY)
Amphetamines, Ur Screen: NOT DETECTED
Barbiturates, Ur Screen: NOT DETECTED
Benzodiazepine, Ur Scrn: NOT DETECTED
Cannabinoid 50 Ng, Ur ~~LOC~~: POSITIVE — AB
Cocaine Metabolite,Ur ~~LOC~~: NOT DETECTED
MDMA (Ecstasy)Ur Screen: NOT DETECTED
Methadone Scn, Ur: NOT DETECTED
Opiate, Ur Screen: NOT DETECTED
Phencyclidine (PCP) Ur S: NOT DETECTED
Tricyclic, Ur Screen: NOT DETECTED

## 2020-04-13 LAB — CBC
HCT: 44.5 % (ref 36.0–46.0)
Hemoglobin: 15.2 g/dL — ABNORMAL HIGH (ref 12.0–15.0)
MCH: 30.1 pg (ref 26.0–34.0)
MCHC: 34.2 g/dL (ref 30.0–36.0)
MCV: 88.1 fL (ref 80.0–100.0)
Platelets: 272 10*3/uL (ref 150–400)
RBC: 5.05 MIL/uL (ref 3.87–5.11)
RDW: 13.8 % (ref 11.5–15.5)
WBC: 8.5 10*3/uL (ref 4.0–10.5)
nRBC: 0 % (ref 0.0–0.2)

## 2020-04-13 LAB — CK: Total CK: 160 U/L (ref 38–234)

## 2020-04-13 LAB — TSH: TSH: 2.74 u[IU]/mL (ref 0.350–4.500)

## 2020-04-13 LAB — ETHANOL: Alcohol, Ethyl (B): <10 mg/dL (ref <10)

## 2020-04-13 LAB — MAGNESIUM: Magnesium: 2.2 mg/dL (ref 1.7–2.4)

## 2020-04-13 LAB — TROPONIN I (HIGH SENSITIVITY): Troponin I (High Sensitivity): 6 ng/L (ref <18)

## 2020-04-13 IMAGING — MR MR HEAD W/O CM
11 of 12 series · 36 of 48 positions shown · non-contrast
Comparison: Head CT earlier today.

CLINICAL DATA: 54-year-old female with altered mental status,
delirium.

EXAM:
MRI HEAD WITHOUT CONTRAST
TECHNIQUE: Multiplanar, multiecho pulse sequences of the brain and surrounding
structures were obtained without intravenous contrast.

[Series 5: ax dwi_tracew · axial · 3.0mm · 0.60mm/px · z∈[-143,+8]mm · 4 of 50 slices shown]
[im 1/50]
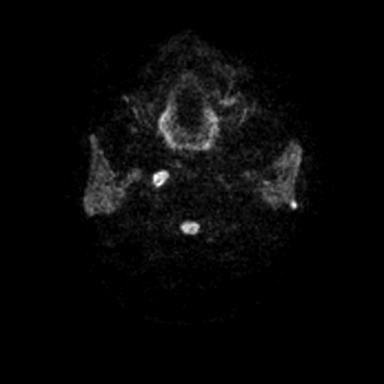
[im 17/50]
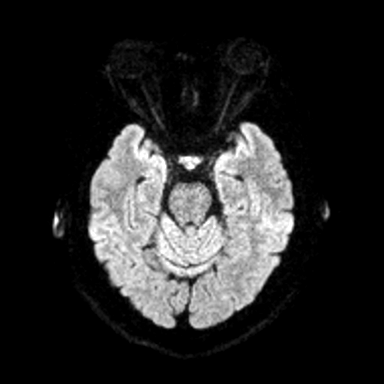
[im 33/50]
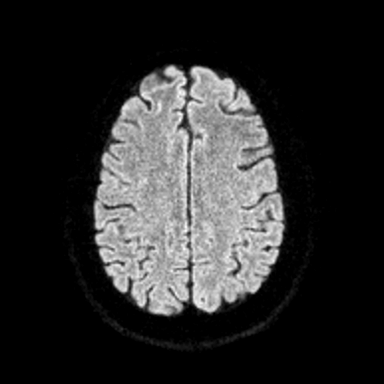
[im 50/50]
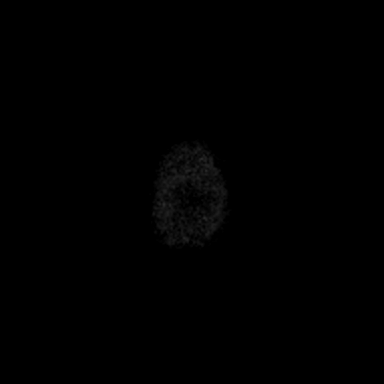

[Series 6: ax dwi_adc · axial · 3.0mm · 0.60mm/px · z∈[-143,+8]mm · 4 of 49 slices shown]
[im 1/49]
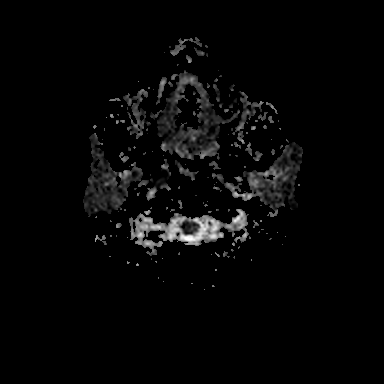
[im 17/49]
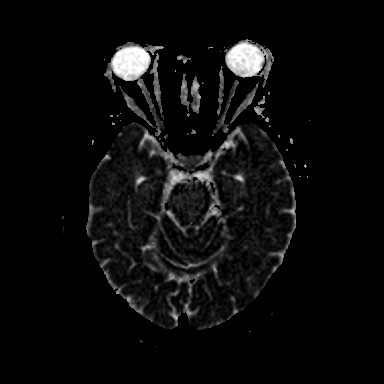
[im 33/49]
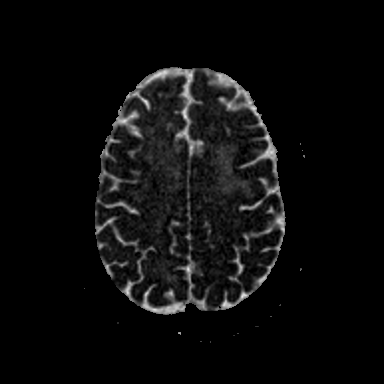
[im 49/49]
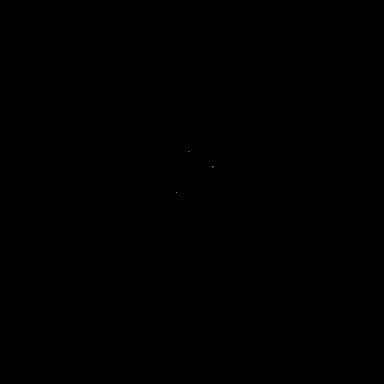

[Series 7: cor dwi_tracew · coronal · 5.0mm · 0.60mm/px · 3 of 40 slices shown]
[im 1/40]
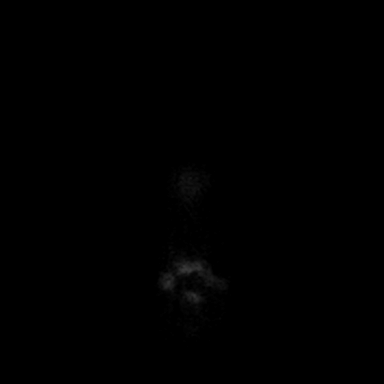
[im 20/40]
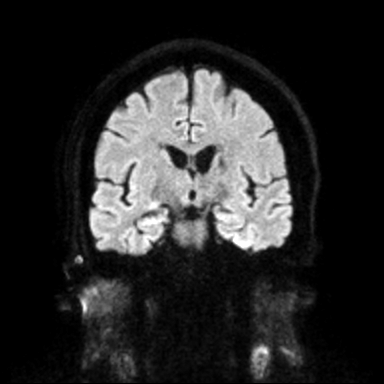
[im 40/40]
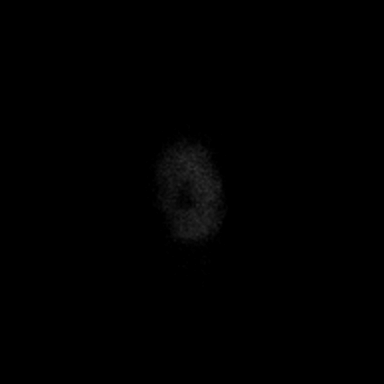

[Series 8: cor dwi_adc · coronal · 5.0mm · 0.60mm/px · 3 of 37 slices shown]
[im 1/37]
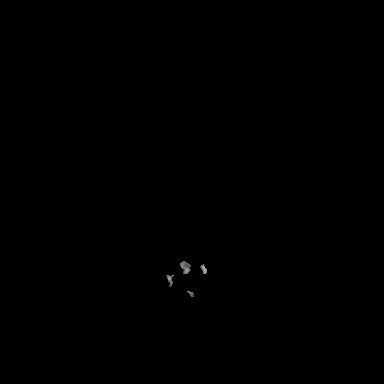
[im 19/37]
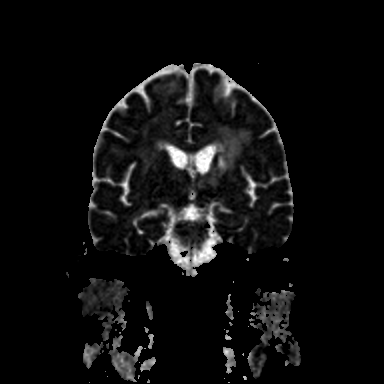
[im 37/37]
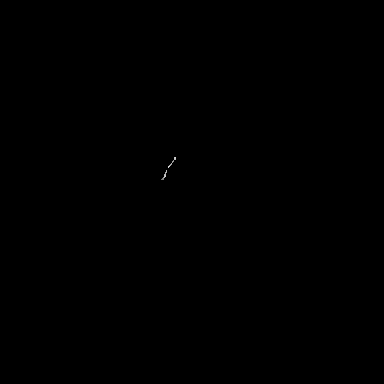

[Series 9: T1 · sagittal · 5.0mm · 0.62mm/px · 2 of 24 slices shown (1 of 2)]
[im 1/24]
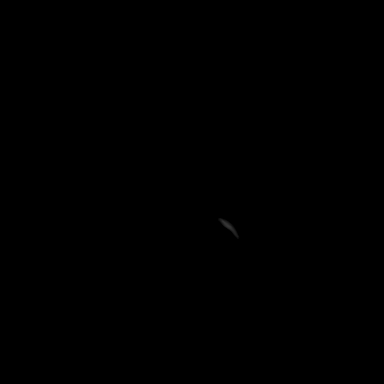
[im 24/24]
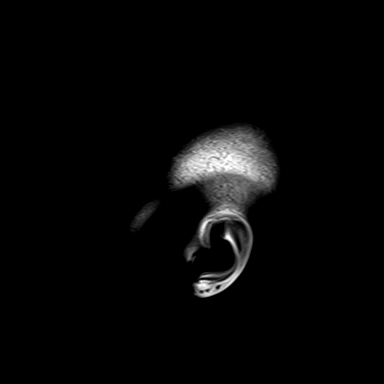

[Series 10: T2 · axial · 5.0mm · 0.53mm/px · z∈[-142,+10]mm · 2 of 28 slices shown (1 of 3)]
[im 1/28]
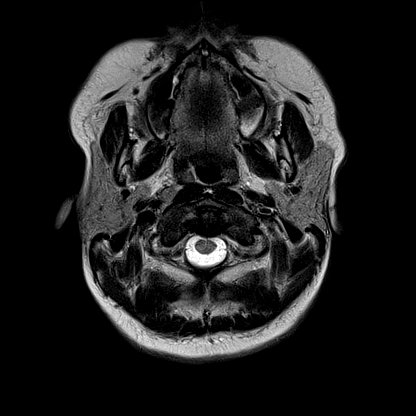
[im 28/28]
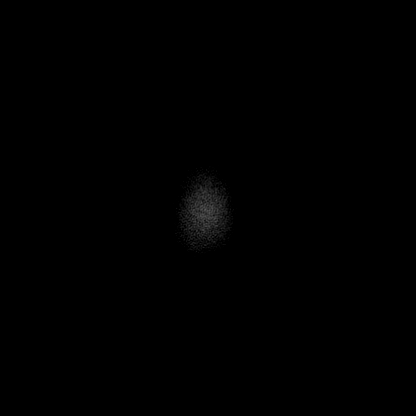

[Series 12: pha_images · axial · 3.0mm · 0.90mm/px · z∈[-148,-97]mm · 2 of 57 slices shown]
[im 1/57]
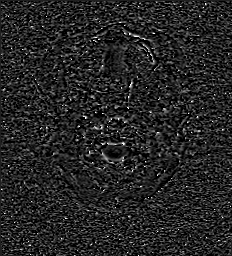
[im 19/57]
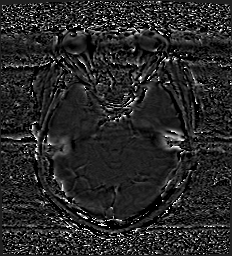

[Series 15: FLAIR · axial · 3.0mm · 0.53mm/px · z∈[-142,+10]mm · 4 of 55 slices shown]
[im 1/55]
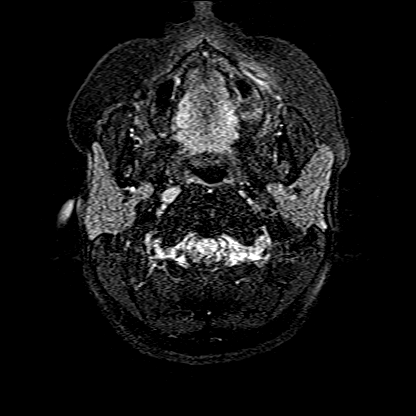
[im 19/55]
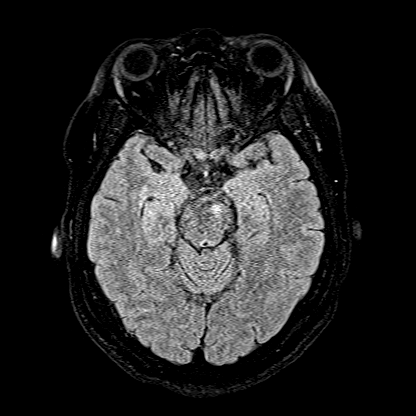
[im 37/55]
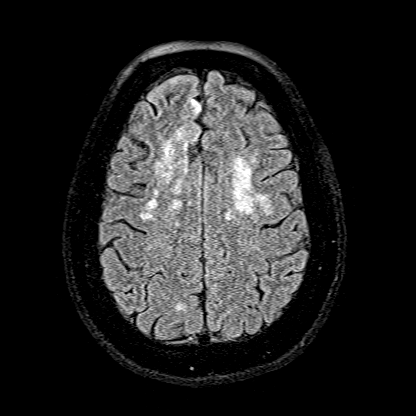
[im 55/55]
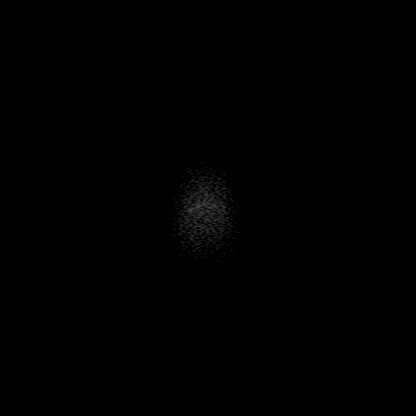

[Series 16: T1 · axial · 1.0mm · 0.98mm/px · z∈[-153,+11]mm · 8 of 176 slices shown (2 of 2)]
[im 1/176]
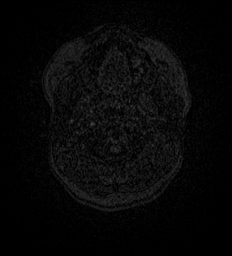
[im 30/176]
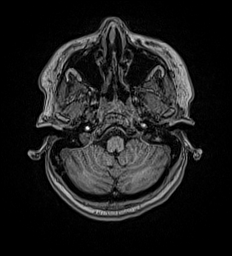
[im 59/176]
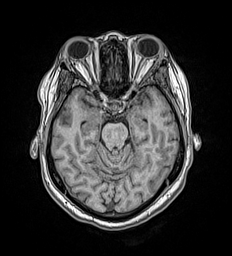
[im 73/176]
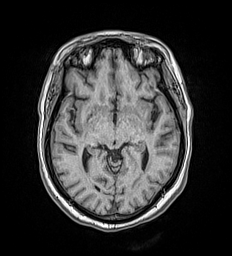
[im 103/176]
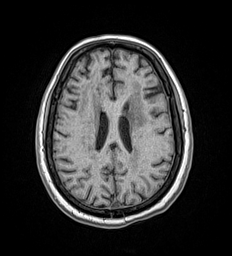
[im 117/176]
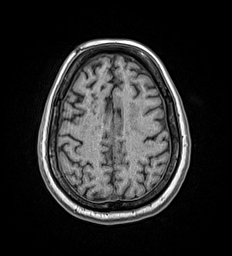
[im 146/176]
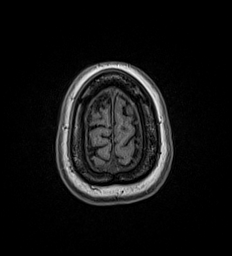
[im 176/176]
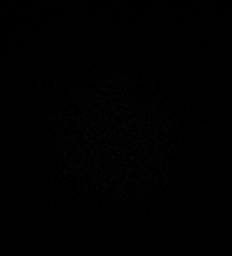

[Series 17: T2 · coronal · 5.0mm · 0.57mm/px · 2 of 30 slices shown (2 of 3)]
[im 1/30]
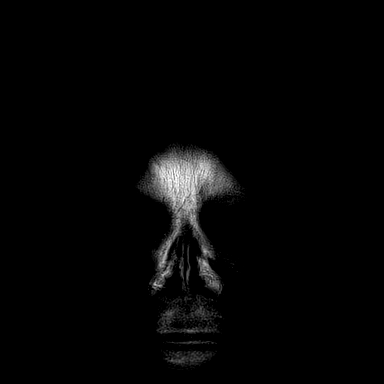
[im 30/30]
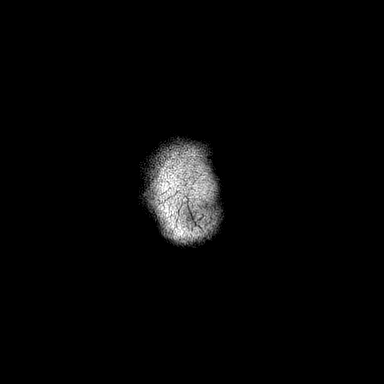

[Series 18: T2 · coronal · 5.0mm · 0.45mm/px · 2 of 31 slices shown (3 of 3)]
[im 1/31]
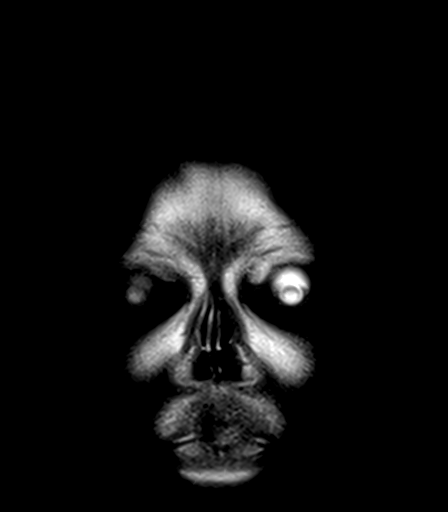
[im 31/31]
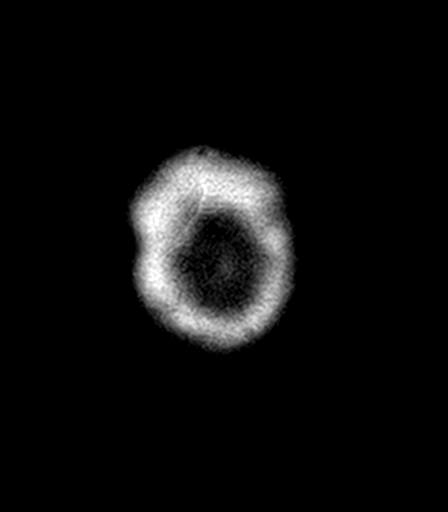

[36 of 48 positions shown; findings below may reference images not displayed]

FINDINGS: Brain: Small foci of patchy restricted diffusion superimposed on
encephalomalacia in the right anterior superior frontal gyrus, right
ACA territory (series 5, image 40 and series 15, image 43).
Underlying chronic cortical and white matter gliosis and mild
hemosiderin. No definite acute hemorrhage or mass effect.

No other restricted diffusion identified. Mild T2 shine through in
the pons.

But extensive signal abnormality most suggestive of chronic ischemia
in the bilateral deep white matter, bilateral deep gray matter
nuclei and pons. Several superimposed small chronic infarcts in the
cerebellum, more so the left. Associated Wallerian degeneration at
the left midbrain. Mild if any associated chronic blood products in
these areas. Mild ex vacuo ventricular enlargement.

Additional moderate nonspecific scattered subcortical white matter
T2 and FLAIR hyperintensity in both hemispheres. The corpus callosum
is relatively spared, although there is some involvement of the
splenium.

No midline shift, mass effect, evidence of mass lesion,
ventriculomegaly, extra-axial collection or acute intracranial
hemorrhage. Cervicomedullary junction and pituitary are within
normal limits.

Vascular: Major intracranial vascular flow voids are preserved.

Skull and upper cervical spine: Negative visible cervical spine.
Visualized bone marrow signal is within normal limits.

Sinuses/Orbits: Negative.

Other: Mastoids are clear. Visible internal auditory structures
appear normal. Trace retained secretions in the nasopharynx.
IMPRESSION: 1. Small acute on chronic Right ACA territory infarct. Underlying
chronic blood products but no acute hemorrhage or mass effect.

2. Superimposed severe signal changes affecting the bilateral
cerebral white matter, bilateral deep gray matter, and pons most
suggestive of advanced small vessel ischemic disease. Relative
sparing of the corpus callosum. Associated Wallerian degeneration in
the left midbrain. Several small chronic infarcts also in the
cerebellum.

## 2020-04-13 IMAGING — CR DG CHEST 2V
1 series · 2 of 2 positions shown · non-contrast
Comparison: [DATE]

CLINICAL DATA: Altered mental status

EXAM:
CHEST - 2 VIEW

[Series 1: dg chest 2 view · 0.14mm/px · 2 of 2 slices shown]
[im 1/2]
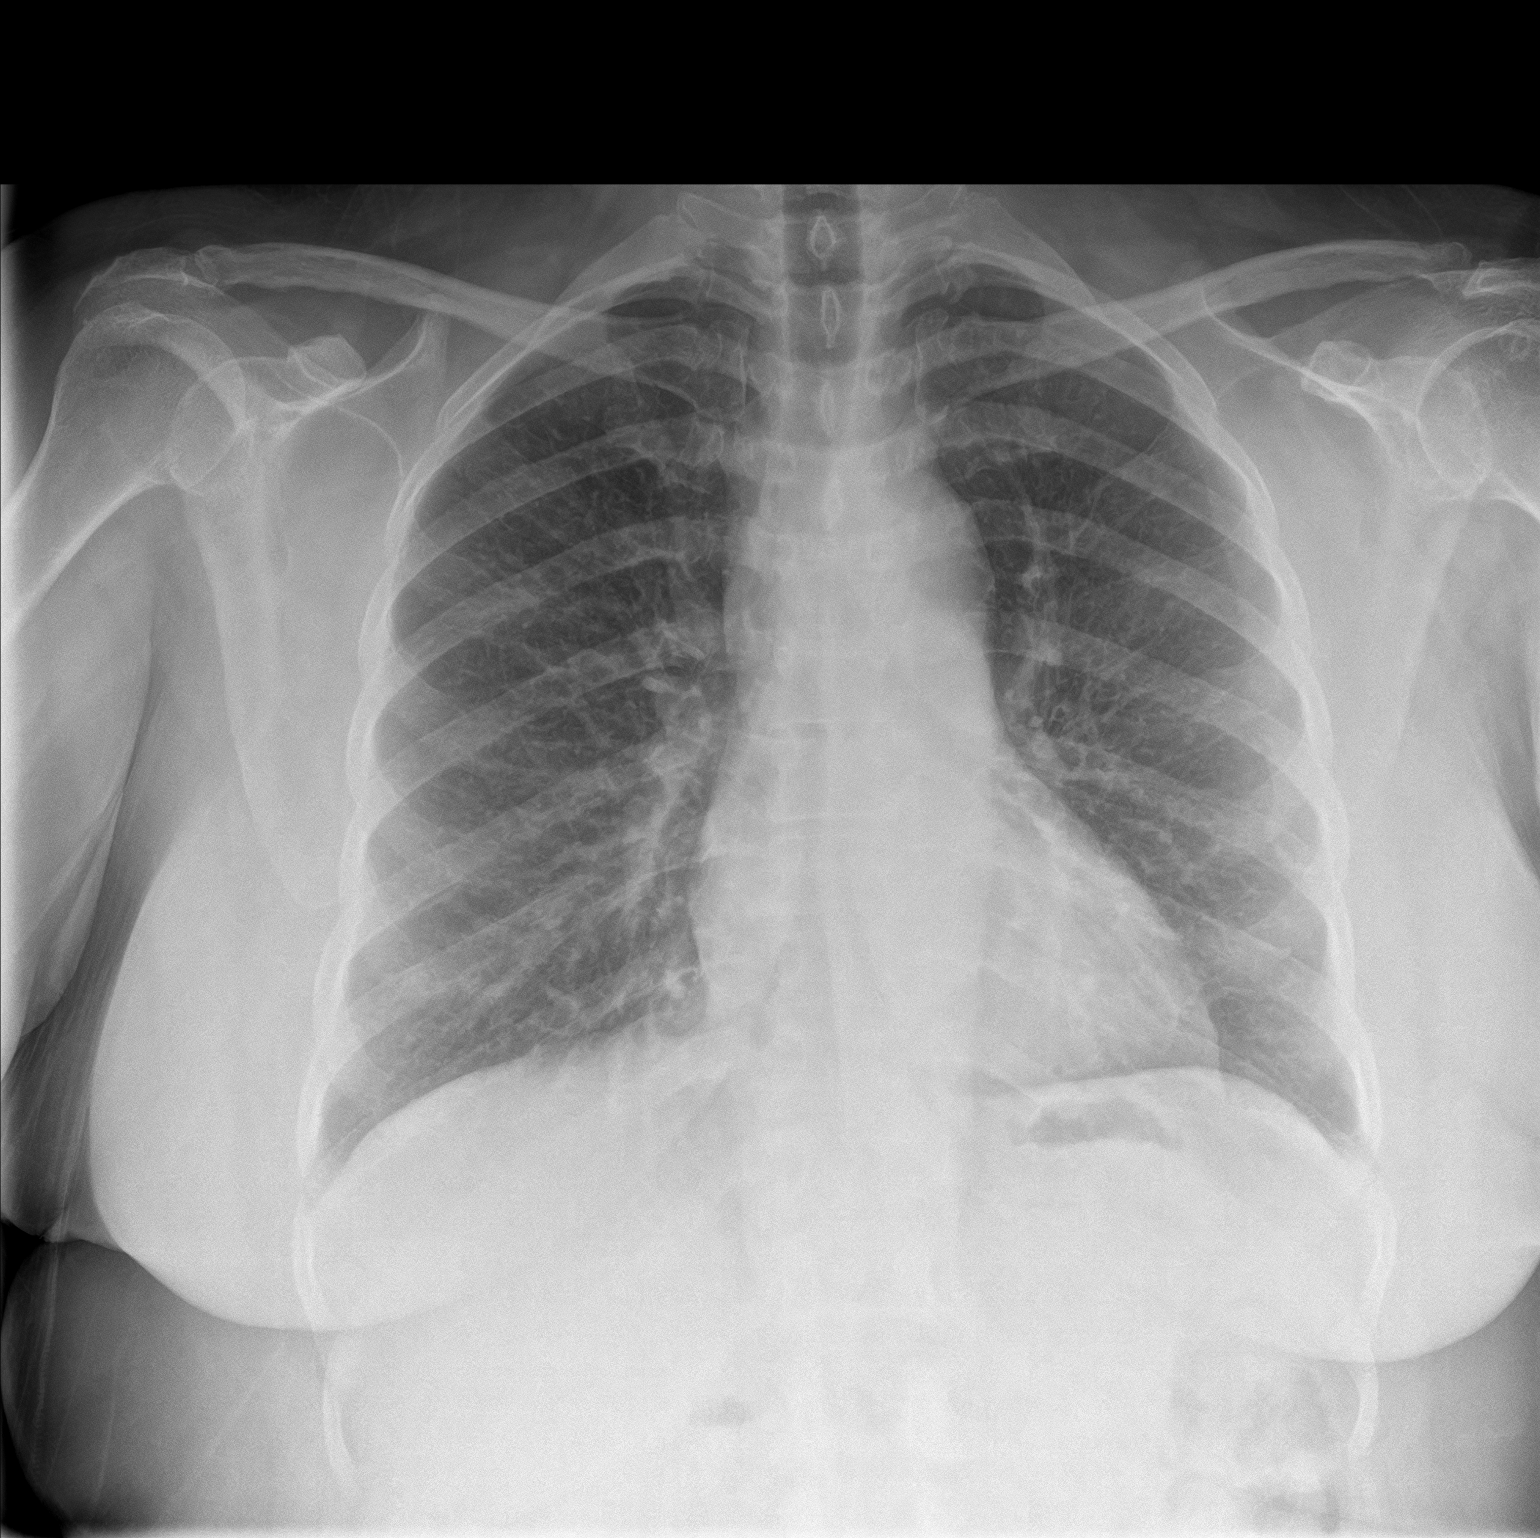
[im 2/2]
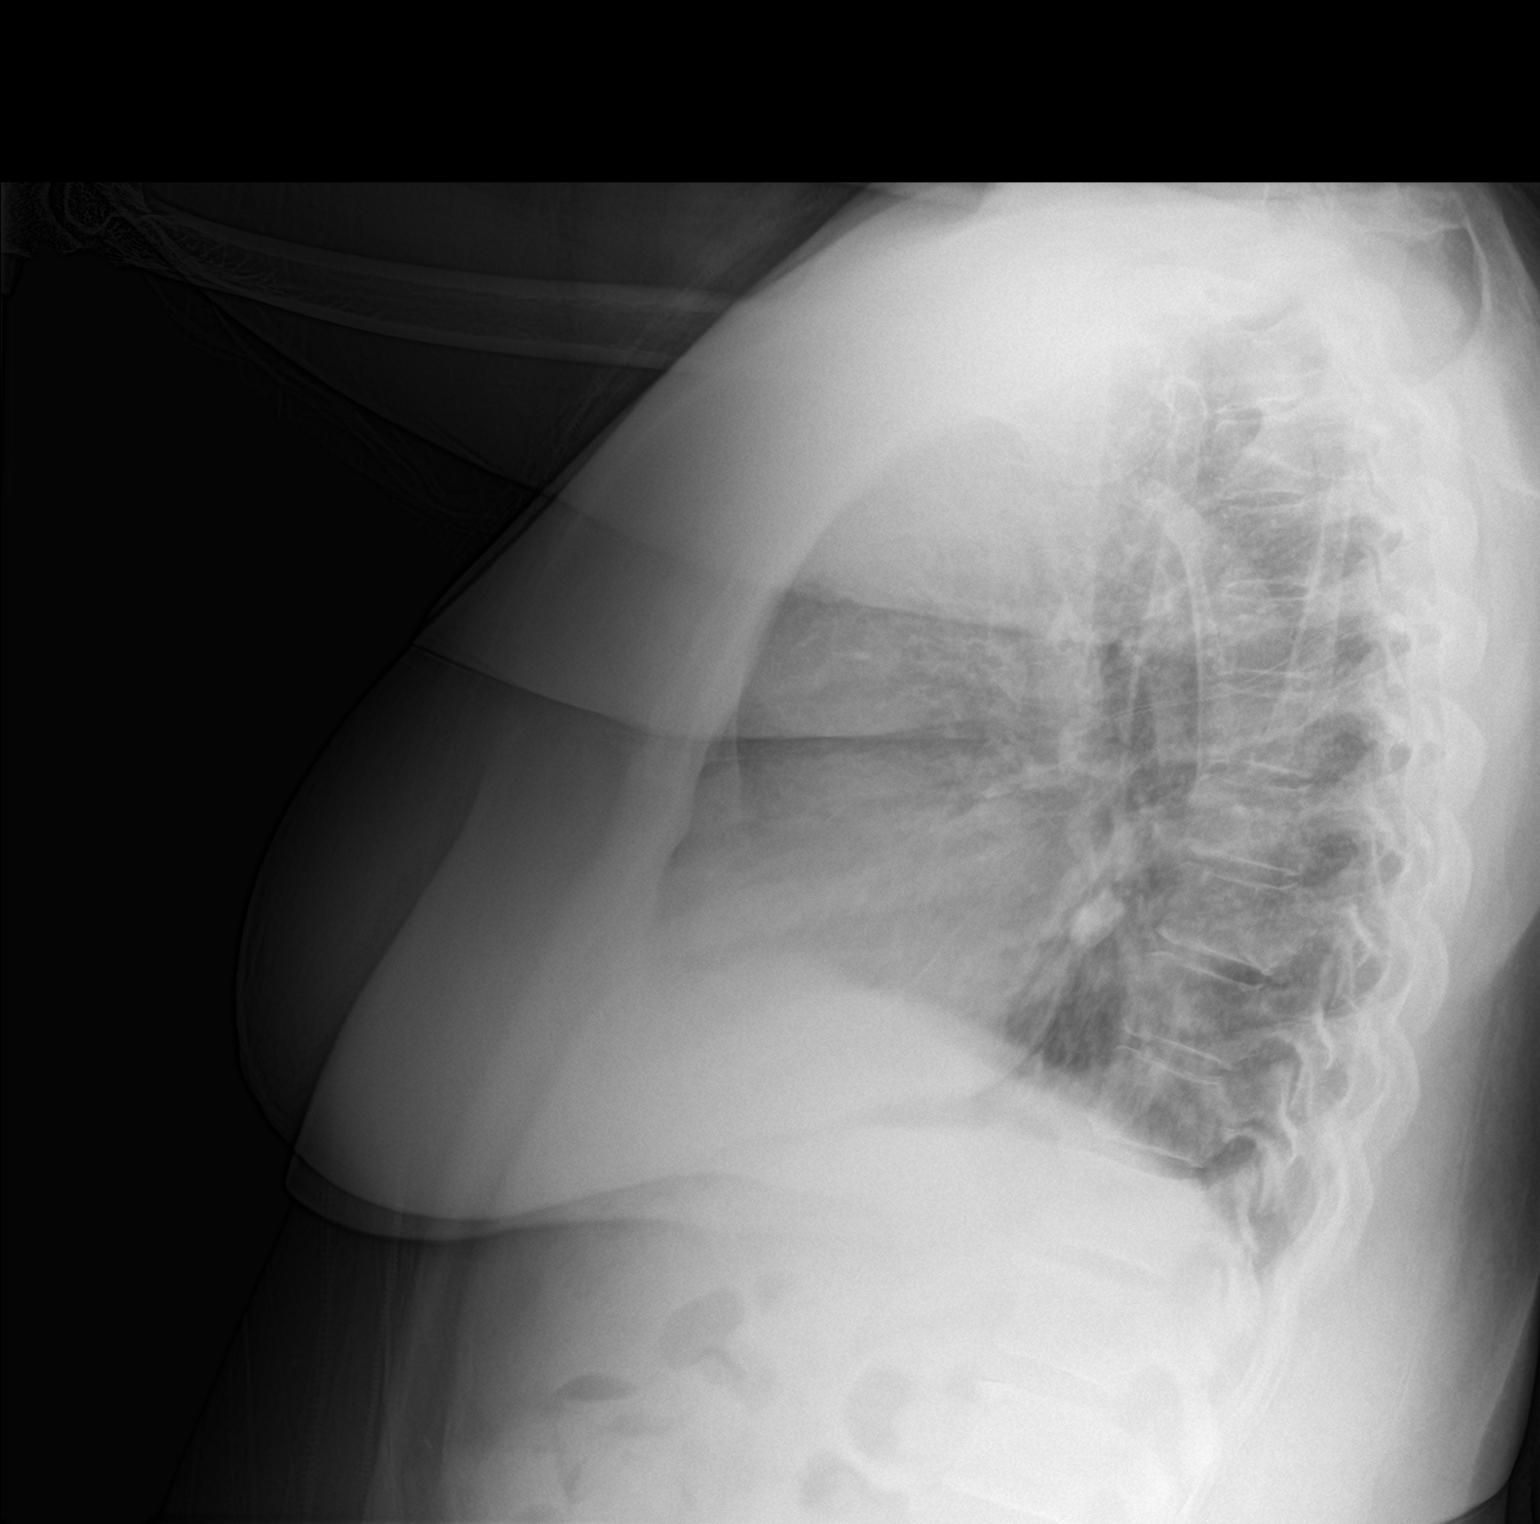

[2 of 2 positions shown; findings below may reference images not displayed]

FINDINGS: Cardiac shadow is within normal limits. The lungs are well aerated
bilaterally. No focal infiltrate or sizable effusion is seen.
Calcified granuloma is noted in the right base. No bony abnormality
is noted.
IMPRESSION: No acute abnormality noted.

## 2020-04-13 IMAGING — CT CT HEAD W/O CM
3 series · 16 of 47 positions shown, 19 images · non-contrast
Comparison: None.

CLINICAL DATA: Delirium.  Altered mental status

EXAM:
CT HEAD WITHOUT CONTRAST
TECHNIQUE: Contiguous axial images were obtained from the base of the skull
through the vertex without intravenous contrast.

[Series 2: head wo · axial · 0.47mm/px · z∈[-129,+1]mm · 10 of 32 slices shown, 13 images]
[im 3/32  brain]
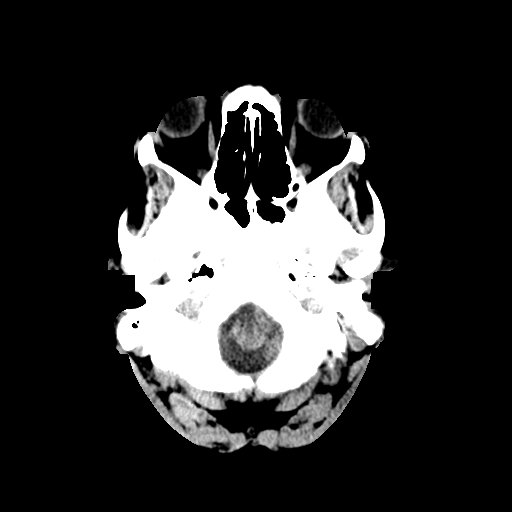
[im 3/32  bone]
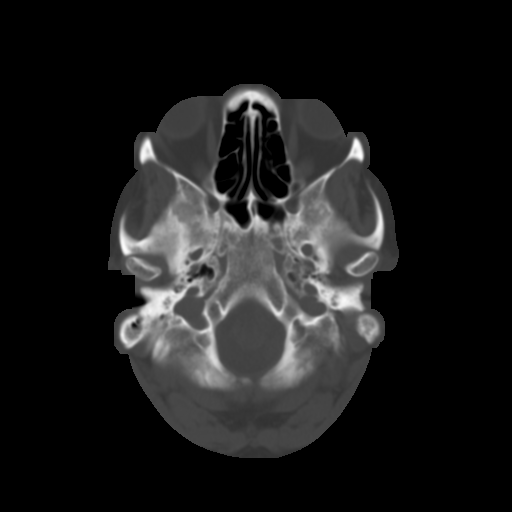
[im 6/32  brain]
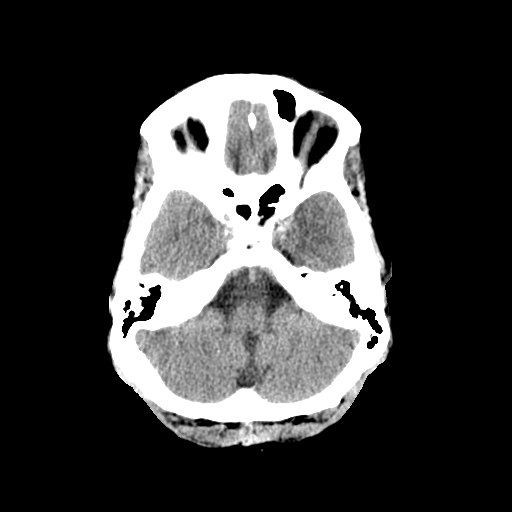
[im 9/32  brain]
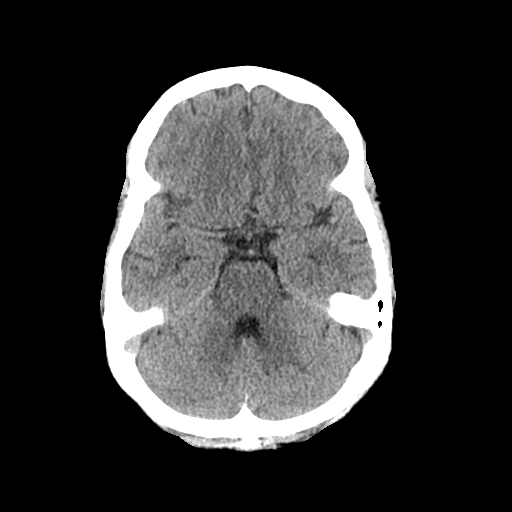
[im 11/32  brain]
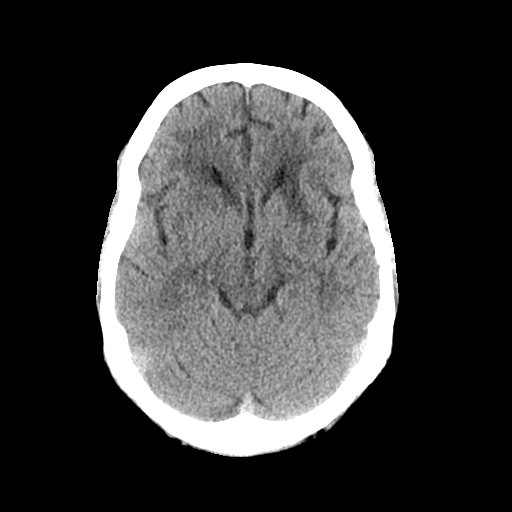
[im 14/32  brain]
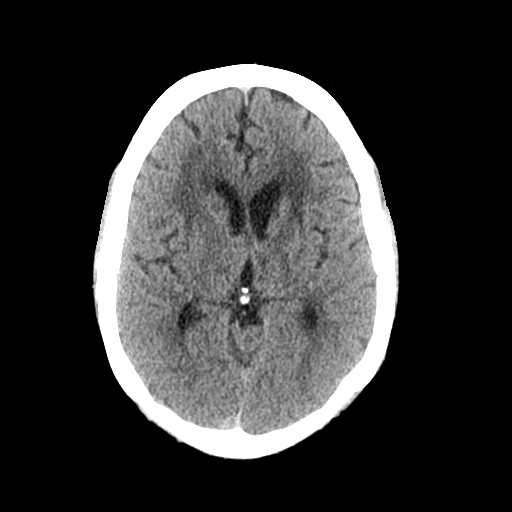
[im 14/32  bone]
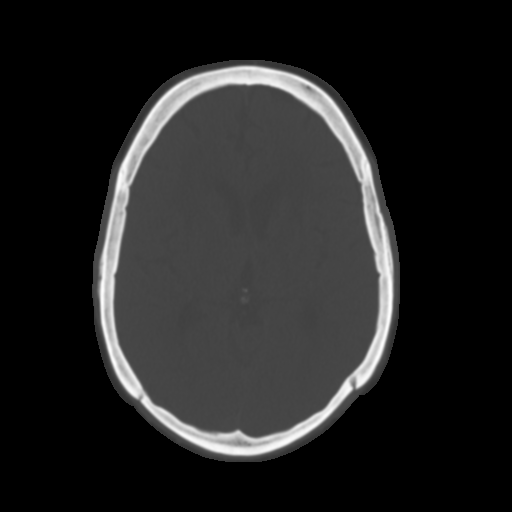
[im 18/32  brain]
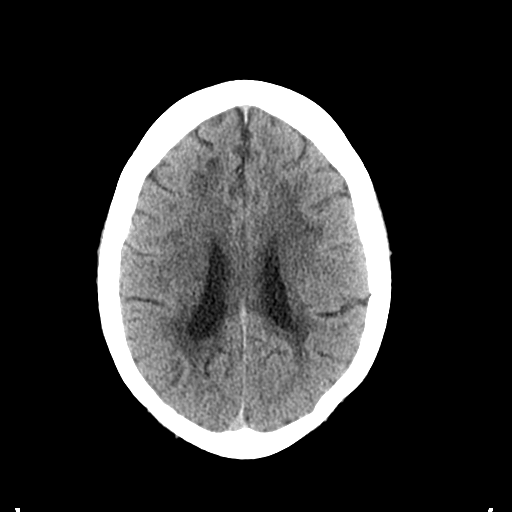
[im 21/32  brain]
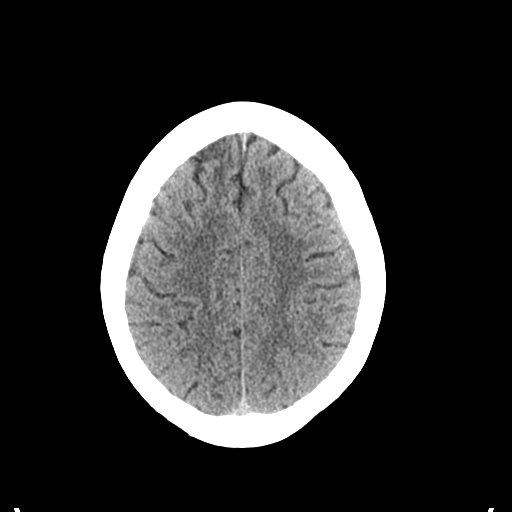
[im 24/32  brain]
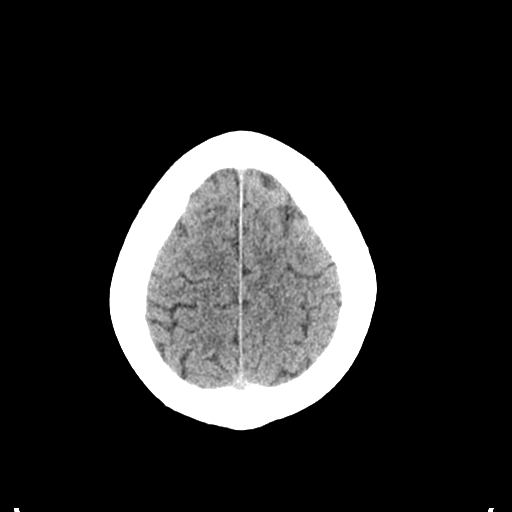
[im 26/32  brain]
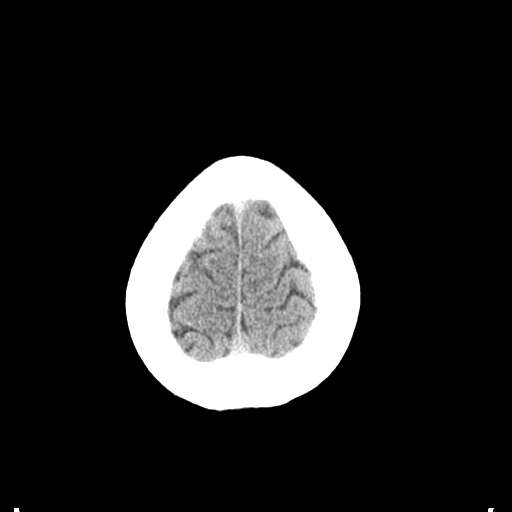
[im 26/32  bone]
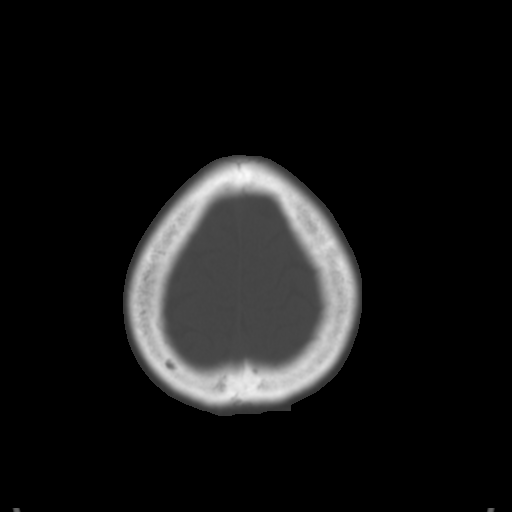
[im 29/32  brain]
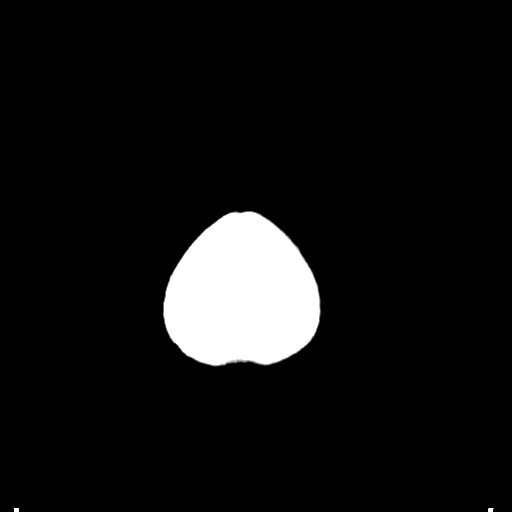

[Series 4: coronal soft tissue · coronal · 0.33mm/px · 3 of 66 slices shown]
[im 22/66  brain]
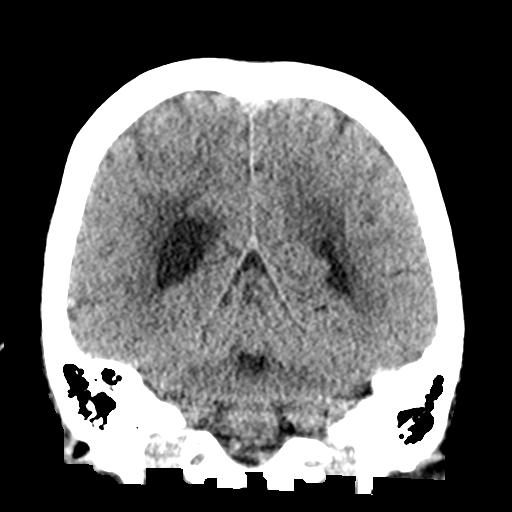
[im 29/66  brain]
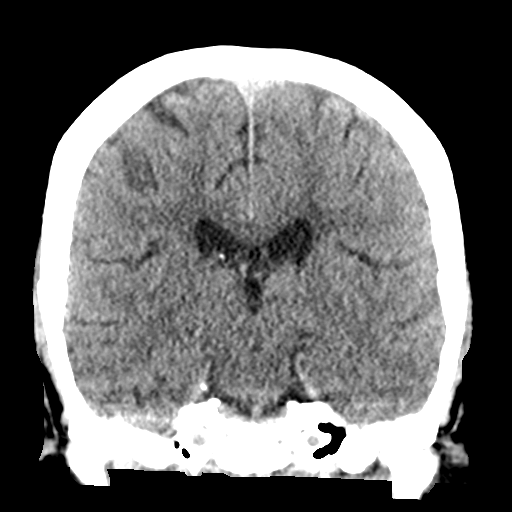
[im 37/66  brain]
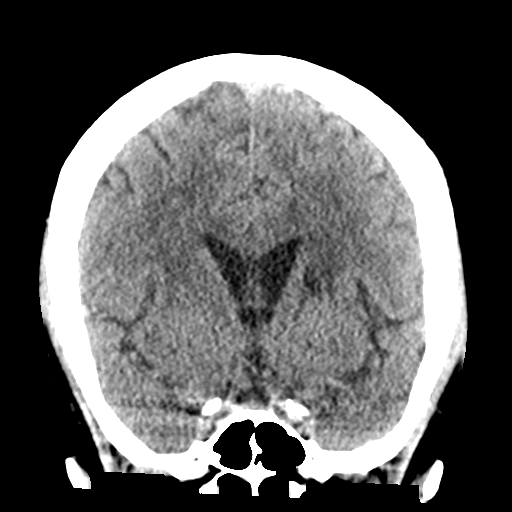

[Series 5: sagittal soft tissue · sagittal · 0.33mm/px · 3 of 56 slices shown]
[im 19/56  brain]
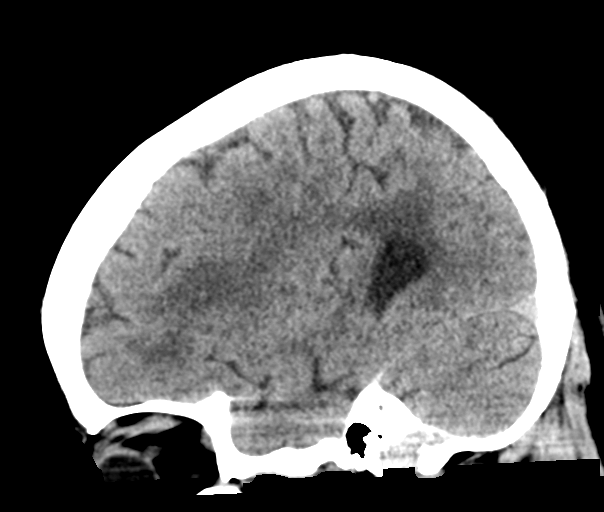
[im 28/56  brain]
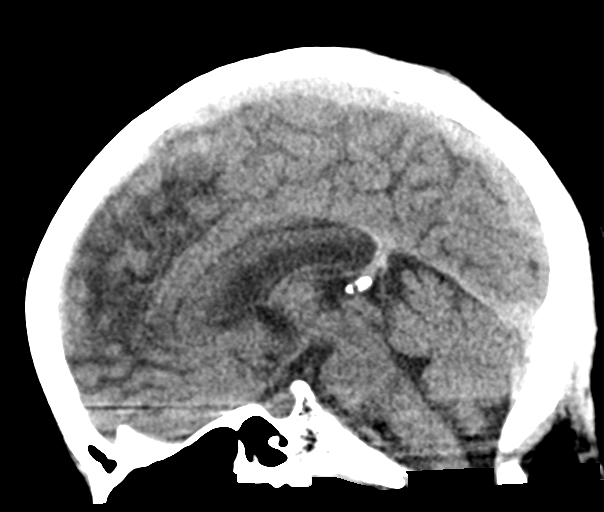
[im 37/56  brain]
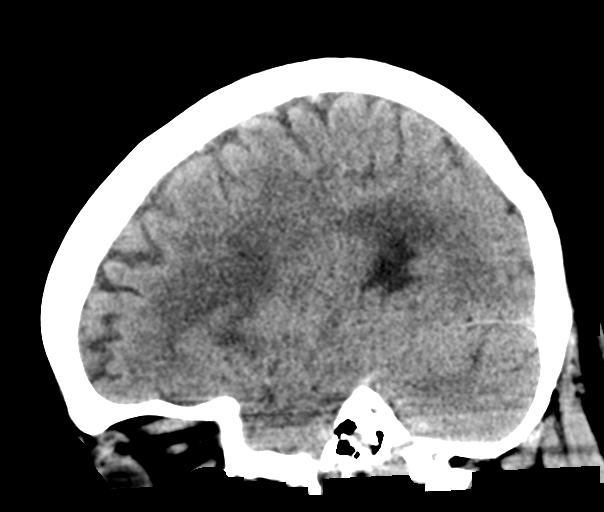

[16 of 47 positions shown; findings below may reference images not displayed]

FINDINGS: Brain: Hypodensity throughout the frontal white matter bilaterally
extending into the internal capsule. This is symmetric and appears
chronic. Milder hypodensity in the parietal white matter bilaterally
also appears chronic. Hypodensity right frontal lobe consistent with
chronic cortical infarct.

Ventricle size normal.  Negative for acute infarct, hemorrhage, mass

Vascular: Negative for hyperdense vessel

Skull: Negative

Sinuses/Orbits: Paranasal sinuses clear.  Negative orbit

Other: None
IMPRESSION: Diffuse white matter changes. While nonspecific, this is likely due
to chronic microvascular ischemic change. Correlate with risk
factors. Chronic appearing infarct right frontal lobe

No acute abnormality.

## 2020-04-13 NOTE — ED Triage Notes (Signed)
Pt presents via [facility] with c/o altered mental status per family. Pt reports being at her baseline status. Family reports pt has recently become more forgetful at home and began urinating on self which is not usual for patient. Pt denies any pain, complaints or any urinary symptoms. Pt currently alert and oriented x4 with this RN.

## 2020-04-13 NOTE — H&P (Signed)
Rebekah Davenport GXQ:[id number] DOB: [birth year] DOA: 04/13/2020     PCP: Patient, No Pcp Per   Outpatient Specialists:   CARDS: Dr. Okey Dupre   Patient arrived to ER on 04/13/20 at 1419 Referred by Attending Delton Prairie, MD   Patient coming from: home Lives   With family    Chief Complaint:  Chief Complaint  Patient presents with  . Altered Mental Status    HPI: Rebekah Davenport is a 54 y.o. female with medical history significant of  COPD, heart murmur    Presented with   patient became forgetful at home started to urinate on herself which is not usual for her.  She is not complaining of any urinary complaints no pain but clearly more altered than her baseline was brought to emergency department by her sister Have had subacute changes forgetting things that she usually does. patietn is usually care provider  Infectious risk factors:  Reports none     Initial COVID TEST  NEGATIVE   Lab Results  Component Value Date   SARSCOV2NAA NEGATIVE 04/13/2020    Regarding pertinent Chronic problems:    Morbid obesity-   BMI Readings from Last 1 Encounters:  04/13/20 45.75 kg/m    COPD - not  followed by pulmonology  not  on baseline oxygen       While in ER: CT head unremarkable CXR unremarkable    Hospitalist was called for admission for acute encephalopathy  The following Work up has been ordered so far:  Orders Placed This Encounter  Procedures  . CT Head Wo Contrast  . MR BRAIN WO CONTRAST  . Comprehensive metabolic panel  . CBC  . Urinalysis, Complete w Microscopic  . Diet NPO time specified  . Document Height and Actual Weight  . Neuro checks q 2 hours x12 hours  . Consult to hospitalist  ALL PATIENTS BEING ADMITTED/HAVING PROCEDURES NEED COVID-19 SCREENING  . CBG monitoring, ED  . EKG 12-Lead    Following Medications were ordered in ER: Medications - No data to display      Consult Orders  (From admission, onward)         Start     Ordered    04/13/20 2205  Consult to hospitalist  ALL PATIENTS BEING ADMITTED/HAVING PROCEDURES NEED COVID-19 SCREENING  Once       Comments: ALL PATIENTS BEING ADMITTED/HAVING PROCEDURES NEED COVID-19 SCREENING  Provider:  (Not yet assigned)  Question Answer Comment  Place call to: hospitalist   Reason for Consult Admit   Diagnosis/Clinical Info for Consult:  Room 11. Adaline Sill, [id number]     04/13/20 2204          Significant initial  Findings: Abnormal Labs Reviewed  COMPREHENSIVE METABOLIC PANEL - Abnormal; Notable for the following components:      Result Value   Glucose, Bld 106 (*)    All other components within normal limits  CBC - Abnormal; Notable for the following components:   Hemoglobin 15.2 (*)    All other components within normal limits  URINALYSIS, COMPLETE (UACMP) WITH MICROSCOPIC - Abnormal; Notable for the following components:   Color, Urine YELLOW (*)    APPearance HAZY (*)    Hgb urine dipstick SMALL (*)    All other components within normal limits  URINE DRUG SCREEN, QUALITATIVE ([facility] ONLY) - Abnormal; Notable for the following components:   Cannabinoid 50 Ng, Ur  POSITIVE (*)    All other components within normal limits  CBC WITH DIFFERENTIAL/PLATELET - Abnormal; Notable for the following components:   RBC 5.13 (*)    Hemoglobin 15.4 (*)    All other components within normal limits    Otherwise labs showing:    Recent Labs  Lab 04/13/20 1635  NA 139  K 3.6  CO2 26  GLUCOSE 106*  BUN 12  CREATININE 0.98  CALCIUM 8.9    Cr   Stable,  Lab Results  Component Value Date   CREATININE 0.98 04/13/2020   CREATININE 0.66 03/14/2016    Recent Labs  Lab 04/13/20 1635  AST 20  ALT 14  ALKPHOS 58  BILITOT 0.6  PROT 7.4  ALBUMIN 3.9   Lab Results  Component Value Date   CALCIUM 8.9 04/13/2020     WBC      Component Value Date/Time   WBC 8.5 04/13/2020 1635   Plt: Lab Results  Component Value Date   PLT 272 04/13/2020     Lab Results   Component Value Date   SARSCOV2NAA NEGATIVE 04/13/2020     Venous  Blood Gas result:  pH 7.41  PCO2 40   ABG    Component Value Date/Time   HCO3 25.4 04/13/2020 2311   O2SAT 97.1 04/13/2020 2311    HG/HCT   Stable,     Component Value Date/Time   HGB 15.2 (H) 04/13/2020 1635   HCT 44.5 04/13/2020 1635   MCV 88.1 04/13/2020 1635    Recent Labs  Lab 04/14/20 0046  AMMONIA 34    Troponin 6 Cardiac Panel (last 3 results) Recent Labs    04/13/20 1642  CKTOTAL 160     ECG: Ordered Personally reviewed by me showing: HR : 61 Rhythm NSR,    nonspecific changes,  QTC 436     UA   no evidence of UTI      Urine analysis:    Component Value Date/Time   COLORURINE YELLOW (A) 04/13/2020 1642   APPEARANCEUR HAZY (A) 04/13/2020 1642   LABSPEC 1.026 04/13/2020 1642   PHURINE 5.0 04/13/2020 1642   GLUCOSEU NEGATIVE 04/13/2020 1642   HGBUR SMALL (A) 04/13/2020 1642   BILIRUBINUR NEGATIVE 04/13/2020 1642   KETONESUR NEGATIVE 04/13/2020 1642   PROTEINUR NEGATIVE 04/13/2020 1642   NITRITE NEGATIVE 04/13/2020 1642   LEUKOCYTESUR NEGATIVE 04/13/2020 1642      Ordered  CT HEADDiffuse white matter changes  CXR -  NON acute  MRI/MRA -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 Underlying chronic blood products but no acute hemorrhage or mass effect. 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 Recommend Neuro endovascular consultation regarding the distal Left ICA stenosis    ED Triage Vitals [04/13/20 1633]  Enc Vitals Group     BP (!) 144/82     Pulse Rate 64     Resp 17     Temp (!) 97.4 F (36.3 C)     Temp Source Oral     SpO2 99 %     Weight 274 lb 14.6 oz (124.7 kg)     Height  (1.651 m)     Head Circumference      Peak Flow      Pain Score 0     Pain Loc      Pain Edu?      Excl. in GC?   XBJY(78)@       Latest  Blood pressure (!) 169/81, pulse (!) 57, temperature 97.8 F (36.6 C), temperature source Oral, resp. rate  18, height 5'  5" (1.651 m), weight 124.7 kg, SpO2 100 %.    Review of Systems:    Pertinent positives include:  confusion  Constitutional:  No weight loss, night sweats, Fevers, chills, fatigue, weight loss  HEENT:  No headaches, Difficulty swallowing,Tooth/dental problems,Sore throat,  No sneezing, itching, ear ache, nasal congestion, post nasal drip,  Cardio-vascular:  No chest pain, Orthopnea, PND, anasarca, dizziness, palpitations.no Bilateral lower extremity swelling  GI:  No heartburn, indigestion, abdominal pain, nausea, vomiting, diarrhea, change in bowel habits, loss of appetite, melena, blood in stool, hematemesis Resp:  no shortness of breath at rest. No dyspnea on exertion, No excess mucus, no productive cough, No non-productive cough, No coughing up of blood.No change in color of mucus.No wheezing. Skin:  no rash or lesions. No jaundice GU:  no dysuria, change in color of urine, no urgency or frequency. No straining to urinate.  No flank pain.  Musculoskeletal:  No joint pain or no joint swelling. No decreased range of motion. No back pain.  Psych:  No change in mood or affect. No depression or anxiety. No memory loss.  Neuro: no localizing neurological complaints, no tingling, no weakness, no double vision, no gait abnormality, no slurred speech, no   All systems reviewed and apart from HOPI all are negative  Past Medical History:   Past Medical History:  Diagnosis Date  . Asthma   . Heart murmur       Past Surgical History:  Procedure Laterality Date  . CYST EXCISION     upper back  . PARTIAL HYSTERECTOMY      Social History:  Ambulatory   Independently      reports that she has been smoking cigarettes. She has a 17.50 pack-year smoking history. She has never used smokeless tobacco. She reports that she does not drink alcohol and does not use drugs.   Family History:   Family History  Problem Relation Age of Onset  . Cancer - Cervical Mother   .  Hyperlipidemia Father   . Hypertension Father     Allergies: No Known Allergies   Prior to Admission medications   Medication Sig Start Date End Date Taking? Authorizing Provider  acetaminophen (TYLENOL) 500 MG tablet Take 1 tablet (500 mg total) by mouth every 6 (six) hours as needed. 03/16/16   End, Cristal Deer, MD  albuterol (PROVENTIL HFA;VENTOLIN HFA) 108 (90 Base) MCG/ACT inhaler Inhale 1-2 puffs into the lungs every 6 (six) hours as needed for wheezing or shortness of breath. 09/15/17   Payton Mccallum, MD  aspirin EC 81 MG tablet Take 81 mg by mouth daily.    [provider]  doxycycline (VIBRA-TABS) 100 MG tablet Take 1 tablet (100 mg total) by mouth 2 (two) times daily. 09/15/17   Payton Mccallum, MD  predniSONE (DELTASONE) 20 MG tablet 3 tabs po once day 1, then 2 tabs po qd x 2 days, then 1 tab po qd x 2 days, then half a tab po qd x 2 days 09/15/17   Payton Mccallum, MD   Physical Exam: Vitals with BMI 04/13/2020 04/13/2020 09/15/2017  Height - 5\' 5"  5' 5.5"  Weight - 274 lbs 15 oz 275 lbs  BMI - 45.75 45.05  Systolic 169 144  Diastolic 81 82 84  Pulse 57 64 66    1. General:  in No  Acute distress    Chronically ill  -appearing 2. Psychological: Alert and   Oriented, flat affect 3. Head/ENT:  Dry Mucous Membranes                          Head Non traumatic, neck supple                            Poor Dentition 4. SKIN: normal   Skin turgor,  Skin clean Dry and intact no rash 5. Heart: Regular rate and rhythm  Mild  Murmur, no Rub or gallop 6. Lungs:  no wheezes or crackles   7. Abdomen: Soft,  non-tender, Non distended obese  bowel sounds present 8. Lower extremities: no clubbing, cyanosis, no    edema 9. Neurologically  strength 5 out of 5 in all 4 extremities cranial nerves II through XII intact 10. MSK: Normal range of motion   All other LABS:     Recent Labs  Lab 04/13/20 1635  WBC 8.5  HGB 15.2*  HCT 44.5  MCV 88.1  PLT 272     Recent  Labs  Lab 04/13/20 1635  NA 139  K 3.6  CL 106  CO2 26  GLUCOSE 106*  BUN 12  CREATININE 0.98  CALCIUM 8.9     Recent Labs  Lab 04/13/20 1635  AST 20  ALT 14  ALKPHOS 58  BILITOT 0.6  PROT 7.4  ALBUMIN 3.9       Cultures: No results found for: SDES, SPECREQUEST, CULT, REPTSTATUS   Radiological Exams on Admission: DG Chest 2 View  Result Date: 04/13/2020 CLINICAL DATA:  Altered mental status EXAM: CHEST - 2 VIEW COMPARISON:  03/14/2016 FINDINGS: Cardiac shadow is within normal limits. The lungs are well aerated bilaterally. No focal infiltrate or sizable effusion is seen. Calcified granuloma is noted in the right base. No bony abnormality is noted. IMPRESSION: No acute abnormality noted. Electronically Signed   By: Alcide CleverMark  Lukens M.D.   On: 04/13/2020 23:19   CT Head Wo Contrast  Result Date: 04/13/2020 CLINICAL DATA:  Delirium.  Altered mental status EXAM: CT HEAD WITHOUT CONTRAST TECHNIQUE: Contiguous axial images were obtained from the base of the skull through the vertex without intravenous contrast. COMPARISON:  None. FINDINGS: Brain: Hypodensity throughout the frontal white matter bilaterally extending into the internal capsule. This is symmetric and appears chronic. Milder hypodensity in the parietal white matter bilaterally also appears chronic. Hypodensity right frontal lobe consistent with chronic cortical infarct. Ventricle size normal.  Negative for acute infarct, hemorrhage, mass Vascular: Negative for hyperdense vessel Skull: Negative Sinuses/Orbits: Paranasal sinuses clear.  Negative orbit Other: None IMPRESSION: Diffuse white matter changes. While nonspecific, this is likely due to chronic microvascular ischemic change. Correlate with risk factors. Chronic appearing infarct right frontal lobe No acute abnormality. Electronically Signed   By: Marlan Palauharles  Clark M.D.   On: 04/13/2020 17:04   MR ANGIO HEAD WO CONTRAST  Addendum Date: 04/14/2020   ADDENDUM REPORT:  04/14/2020 02:17 ADDENDUM: Study discussed by telephone with Dr. Para Marchuncan on 04/14/2020 at 02:17 . Electronically Signed   By: Odessa FlemingH  Hall M.D.   On: 04/14/2020 02:17   Result Date: 04/14/2020 CLINICAL DATA:  54 year old female with altered mental status and acute on chronic ischemia on brain MRI tonight. EXAM: MRA HEAD WITHOUT CONTRAST TECHNIQUE: Angiographic images of the Circle of Willis were obtained using MRA technique without intravenous contrast. COMPARISON:  Brain MRI earlier tonight.  Head CT FINDINGS: Antegrade flow in the posterior circulation. Fairly codominant distal vertebral arteries are  patent to the basilar, with mildly fenestrated vertebrobasilar junction (normal variant). There is distal vertebral irregularity suggesting atherosclerosis, but no significant distal vertebral stenosis. Both PICA origins remain patent. Patent basilar artery with mild irregularity, no stenosis. Patent SCA and PCA origins. Right posterior communicating artery is present, the left is diminutive or absent. Left PCA branches are within normal limits. There is multifocal moderate irregularity and stenosis of the right PCA (series 1043, image 14) which remains patent. Antegrade flow in both ICA siphons. Probable artifactual loss of flow signal in the a petrous left ICA from asymmetric left petrous apex air cell susceptibility. The right ICA siphon is patent with mild irregularity. The left siphon is patent but with severe supraclinoid stenosis including a small flow gap of the distal ICA (series 1053, image 17). Carotid termini remain patent. MCA and ACA origins are patent. The left A1 appears dominant. Anterior communicating artery is within normal limits. The right ACA A2 segment is occluded just distal to the A-comm (series 1053, image 12). And the left A2/A3 is patent but with moderate to severe attenuation of the vessel throughout. Left MCA M1 segment is mildly irregular without stenosis. Left MCA bifurcation and visible  left MCA branches are within normal limits. Right MCA M1 is patent with mild irregularity. There is moderate stenosis at the right anterior temporal artery origin. Right MCA bifurcation is patent without stenosis. Possible moderate irregularity and stenosis of visible right MCA M3 branches. IMPRESSION: 1. 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 2. Positive also for evidence of widespread severe intracranial atherosclerosis with hemodynamically significant stenoses: - Severe stenosis distal Left ICA. - patent Left ACA moderately to severely attenuated throughout. - Moderate multifocal stenoses Right PCA. 3. Recommend Neuro endovascular consultation regarding the distal Left ICA stenosis (#2). Electronically Signed: By: Odessa Fleming M.D. On: 04/14/2020 02:04   MR BRAIN WO CONTRAST  Result Date: 04/13/2020 CLINICAL DATA:  54 year old female with altered mental status, delirium. EXAM: MRI HEAD WITHOUT CONTRAST TECHNIQUE: Multiplanar, multiecho pulse sequences of the brain and surrounding structures were obtained without intravenous contrast. COMPARISON:  Head CT earlier today. FINDINGS: Brain: Small foci of patchy restricted diffusion superimposed on encephalomalacia in the right anterior superior frontal gyrus, right ACA territory (series 5, image 40 and series 15, image 43). Underlying chronic cortical and white matter gliosis and mild hemosiderin. No definite acute hemorrhage or mass effect. No other restricted diffusion identified. Mild T2 shine through in the pons. But extensive signal abnormality most suggestive of chronic ischemia in the bilateral deep white matter, bilateral deep gray matter nuclei and pons. Several superimposed small chronic infarcts in the cerebellum, more so the left. Associated Wallerian degeneration at the left midbrain. Mild if any associated chronic blood products in these areas. Mild ex vacuo ventricular enlargement. Additional moderate nonspecific scattered subcortical white  matter T2 and FLAIR hyperintensity in both hemispheres. The corpus callosum is relatively spared, although there is some involvement of the splenium. No midline shift, mass effect, evidence of mass lesion, ventriculomegaly, extra-axial collection or acute intracranial hemorrhage. Cervicomedullary junction and pituitary are within normal limits. Vascular: Major intracranial vascular flow voids are preserved. Skull and upper cervical spine: Negative visible cervical spine. Visualized bone marrow signal is within normal limits. Sinuses/Orbits: Negative. Other: Mastoids are clear. Visible internal auditory structures appear normal. Trace retained secretions in the nasopharynx. IMPRESSION: 1. 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 Underlying chronic blood products but no acute hemorrhage or mass effect. 2. Superimposed severe signal  changes affecting the bilateral cerebral white matter, bilateral deep gray matter, and pons most suggestive of advanced small vessel ischemic disease. Relative sparing of the corpus callosum. 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 Several small chronic infarcts also in the cerebellum. Electronically Signed   By: Odessa Fleming M.D.   On: 04/13/2020 23:07    Chart has been reviewed    Assessment/Plan   54 y.o. female with medical history significant of  COPD, heart murmur     Admitted for acute encephalopathy and new CVA  Present on Admission:  CVA -  - will admit based on TIA/CVA protocol, LAST seen NORMAL was over 1 wk ago per family        Monitor on Tele       MRA/MRI  Resulted - showing acute ischemic CVA and multiple chronic findings        Carotid Doppler         Echo to evaluate for possible embolic source,        obtain cardiac enzymes,  ECG,   Lipid panel, TSH.        Order PT/OT evaluation.       keep nothing by mouth until passes swallow eval        Will make sure patient is on   Statin given blood products noted on MRI held off on  aspirin for tonight would defer to Neurology in AM Appear chronic       Allow permissive Hypertension keep BP <220/120        Neurology consulted to see in AM  given severe cerebrovascular disease also consulted vascular surgery  . Acute encephalopathy -   - most likely multifactorial secondary to  Multiple CVA's  - neurological exam appears to be nonfocal but patient unable to cooperate fully   - VBG unremarkable no evidence of hypercarbia    - no history of liver disease ammonia unremarkable   Other plan as per orders.  DVT prophylaxis:  SCD     Code Status:    Code Status: Not on file FULL CODE  I had personally discussed CODE STATUS with patient      Family Communication:   Family not at  Bedside    Disposition Plan:   To home once workup is complete and patient is stable   Following barriers for discharge:                                                                                 Will need consultants to evaluate patient prior to discharge                       Would benefit from PT/OT eval prior to DC  Ordered                   Swallow eval - SLP ordered                   Diabetes care coordinator                   Transition of care consulted  Nutrition    consulted                  Wound care  consulted                   Palliative care    consulted                   Behavioral health  consulted                    Consults called: Neurology notified through smart chat and discussed case with Tele neurology at night   Vascular surgery sent smart chat to Dr. Wyn Quaker  Admission status:  ED Disposition    ED Disposition Condition Comment   Admit  Hospital Area: [facility] [[id number]]  Level of Care: Med-Surg [16]  Covid Evaluation: Symptomatic Person Under Investigation (PUI)  Diagnosis: Acute encephalopathy [098119]  Admitting Physician: Therisa Doyne [[id number]]  Attending Physician: Therisa Doyne [[id number]]        Obs   Level of care     tele   indefinitely please discontinue once patient no longer qualifies COVID-19 Labs   Lab Results  Component Value Date   SARSCOV2NAA NEGATIVE 04/13/2020     Precautions: admitted as   Covid Negative     PPE: Used by the provider:   P100  eye Goggles,  Gloves  gown   Janina Trafton 04/14/2020, 2:44 AM    [facility]     after 2 AM please page floor coverage PA If 7AM-7PM, please contact the day team taking care of the patient using [email or web address]   Patient was evaluated in the context of the global COVID-19 pandemic, which necessitated consideration that the patient might be at risk for infection with the SARS-CoV-2 virus that causes COVID-19. Institutional protocols and algorithms that pertain to the evaluation of patients at risk for COVID-19 are in a state of rapid change based on information released by regulatory bodies including the CDC and federal and state organizations. These policies and algorithms were followed during the patient's care.

## 2020-04-13 NOTE — ED Notes (Addendum)
Pt attempting to leave hospital. This RN informed pt that if she leaves she will not be able to come back to her room and will need to go through the triage process again. Pt informed she will need to sign AMA paper. Pt and sister state understanding and will stay.

## 2020-04-13 NOTE — ED Provider Notes (Signed)
[facility] Emergency Department Provider Note ____________________________________________   First MD Initiated Contact with Patient 04/13/20 2003     (approximate)  I have reviewed the triage vital signs and the nursing notes.  HISTORY  Chief Complaint Altered Mental Status   HPI EVA GRIFFO is a 54 y.o. femalewho presents to the ED for evaluation of altered mental status.  Chart review indicates no relevant medical history.  Patient presents to the ED with her sister due to subacute behavioral changes.  Patient ports feeling well and has no complaints.  She indicates that she does not know why she that she is here.    Sister at the bedside provides majority of history.  Sister reports concern over the past few weeks that patient has had change to her gait and no longer functioning at her baseline.  Patient typically cares for their father at home, and patient is reportedly been forgetting multiple tasks that she is accustomed to.  Sister reports concern for slow and relatively unsteady gait, forgetfulness and "just not acting right."  Patient takes no medications regularly.  There is a strong family history of strokes at early ages.    Past Medical History:  Diagnosis Date  . Asthma   . Heart murmur     There are no problems to display for this patient.   Past Surgical History:  Procedure Laterality Date  . CYST EXCISION     upper back  . PARTIAL HYSTERECTOMY      Prior to Admission medications   Not on File    Allergies Patient has no known allergies.  Family History  Problem Relation Age of Onset  . Cancer - Cervical Mother   . Hyperlipidemia Father   . Hypertension Father     Social History Social History   Tobacco Use  . Smoking status: Current Every Day Smoker    Packs/day: 0.50    Years: 35.00    Pack years: 17.50    Types: Cigarettes  . Smokeless tobacco: Never Used  Vaping Use  . Vaping Use: Never used   Substance Use Topics  . Alcohol use: No  . Drug use: No    Review of Systems  Constitutional: No fever/chills.  Positive generalized weakness. Eyes: No visual changes. ENT: No sore throat. Cardiovascular: Denies chest pain. Respiratory: Denies shortness of breath. Gastrointestinal: No abdominal pain.  No nausea, no vomiting.  No diarrhea.  No constipation. Genitourinary: Negative for dysuria. Musculoskeletal: Negative for back pain. Skin: Negative for rash. Neurological: Negative for headaches, focal weakness or numbness.  ____________________________________________   PHYSICAL EXAM:  VITAL SIGNS: Vitals:   04/13/20 2030 04/13/20 2100  BP: (!) 169/81 (!) 172/99  Pulse: (!) 57 (!) 52  Resp: 18   Temp: 97.8 F (36.6 C)   SpO2: 100% 98%      Constitutional: Alert and oriented. Well appearing and in no acute distress.  Able stand independently and ambulate around the room with a slow but steady gait.  Obese. Eyes: Conjunctivae are normal. PERRL. EOMI. Head: Atraumatic. Nose: No congestion/rhinnorhea. Mouth/Throat: Mucous membranes are moist.  Oropharynx non-erythematous. Neck: No stridor. No cervical spine tenderness to palpation. Cardiovascular: Normal rate, regular rhythm. Grossly normal heart sounds.  Good peripheral circulation. Respiratory: Normal respiratory effort.  No retractions. Lungs CTAB. Gastrointestinal: Soft , nondistended, nontender to palpation. No abdominal bruits. No CVA tenderness. Musculoskeletal: No lower extremity tenderness nor edema.  No joint effusions. No signs of acute trauma. Neurologic:  Normal speech  and language. No gross focal neurologic deficits are appreciated. No gait instability noted. Cranial nerves II through XII intact 5/5 strength and sensation in all 4 extremities Skin:  Skin is warm, dry and intact. No rash noted. Psychiatric: Mood and affect are normal. Speech and behavior are  normal.  ____________________________________________   LABS (all labs ordered are listed, but only abnormal results are displayed)  Labs Reviewed  COMPREHENSIVE METABOLIC PANEL - Abnormal; Notable for the following components:      Result Value   Glucose, Bld 106 (*)    All other components within normal limits  CBC - Abnormal; Notable for the following components:   Hemoglobin 15.2 (*)    All other components within normal limits  URINALYSIS, COMPLETE (UACMP) WITH MICROSCOPIC - Abnormal; Notable for the following components:   Color, Urine YELLOW (*)    APPearance HAZY (*)    Hgb urine dipstick SMALL (*)    All other components within normal limits  URINE DRUG SCREEN, QUALITATIVE ([facility] ONLY) - Abnormal; Notable for the following components:   Cannabinoid 50 Ng, Ur [place] POSITIVE (*)    All other components within normal limits  CBC WITH DIFFERENTIAL/PLATELET - Abnormal; Notable for the following components:   RBC 5.13 (*)    Hemoglobin 15.4 (*)    All other components within normal limits  RESPIRATORY PANEL BY RT PCR (FLU A&B, COVID)  CK  MAGNESIUM  ETHANOL  TSH  AMMONIA  BLOOD GAS, VENOUS  CBG MONITORING, ED  TROPONIN I (HIGH SENSITIVITY)   ____________________________________________  12 Lead EKG  Sinus rhythm, rate of 61 bpm.  Normal axis.  Normal intervals.  Nonspecific ST changes anterolaterally.  No evidence of acute ischemia. ____________________________________________  RADIOLOGY  ED MD interpretation: CT head with evidence of chronic CVA.  2 view CXR without evidence of acute cardiopulmonary pathology.  Official radiology report(s): DG Chest 2 View  Result Date: 04/13/2020 CLINICAL DATA:  Altered mental status EXAM: CHEST - 2 VIEW COMPARISON:  03/14/2016 FINDINGS: Cardiac shadow is within normal limits. The lungs are well aerated bilaterally. No focal infiltrate or sizable effusion is seen. Calcified granuloma is noted in the right base. No bony abnormality  is noted. IMPRESSION: No acute abnormality noted. Electronically Signed   By: Alcide Clever M.D.   On: 04/13/2020 23:19   CT Head Wo Contrast  Result Date: 04/13/2020 CLINICAL DATA:  Delirium.  Altered mental status EXAM: CT HEAD WITHOUT CONTRAST TECHNIQUE: Contiguous axial images were obtained from the base of the skull through the vertex without intravenous contrast. COMPARISON:  None. FINDINGS: Brain: Hypodensity throughout the frontal white matter bilaterally extending into the internal capsule. This is symmetric and appears chronic. Milder hypodensity in the parietal white matter bilaterally also appears chronic. Hypodensity right frontal lobe consistent with chronic cortical infarct. Ventricle size normal.  Negative for acute infarct, hemorrhage, mass Vascular: Negative for hyperdense vessel Skull: Negative Sinuses/Orbits: Paranasal sinuses clear.  Negative orbit Other: None IMPRESSION: Diffuse white matter changes. While nonspecific, this is likely due to chronic microvascular ischemic change. Correlate with risk factors. Chronic appearing infarct right frontal lobe No acute abnormality. Electronically Signed   By: Marlan Palau M.D.   On: 04/13/2020 17:04   MR BRAIN WO CONTRAST  Result Date: 04/13/2020 CLINICAL DATA:  54 year old female with altered mental status, delirium. EXAM: MRI HEAD WITHOUT CONTRAST TECHNIQUE: Multiplanar, multiecho pulse sequences of the brain and surrounding structures were obtained without intravenous contrast. COMPARISON:  Head CT earlier today. FINDINGS: Brain: Small  foci of patchy restricted diffusion superimposed on encephalomalacia in the right anterior superior frontal gyrus, right ACA territory (series 5, image 40 and series 15, image 43). Underlying chronic cortical and white matter gliosis and mild hemosiderin. No definite acute hemorrhage or mass effect. No other restricted diffusion identified. Mild T2 shine through in the pons. But extensive signal abnormality  most suggestive of chronic ischemia in the bilateral deep white matter, bilateral deep gray matter nuclei and pons. Several superimposed small chronic infarcts in the cerebellum, more so the left. Associated Wallerian degeneration at the left midbrain. Mild if any associated chronic blood products in these areas. Mild ex vacuo ventricular enlargement. Additional moderate nonspecific scattered subcortical white matter T2 and FLAIR hyperintensity in both hemispheres. The corpus callosum is relatively spared, although there is some involvement of the splenium. No midline shift, mass effect, evidence of mass lesion, ventriculomegaly, extra-axial collection or acute intracranial hemorrhage. Cervicomedullary junction and pituitary are within normal limits. Vascular: Major intracranial vascular flow voids are preserved. Skull and upper cervical spine: Negative visible cervical spine. Visualized bone marrow signal is within normal limits. Sinuses/Orbits: Negative. Other: Mastoids are clear. Visible internal auditory structures appear normal. Trace retained secretions in the nasopharynx. IMPRESSION: 1. 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 Underlying chronic blood products but no acute hemorrhage or mass effect. 2. Superimposed severe signal changes affecting the bilateral cerebral white matter, bilateral deep gray matter, and pons most suggestive of advanced small vessel ischemic disease. Relative sparing of the corpus callosum. 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 Several small chronic infarcts also in the cerebellum. Electronically Signed   By: Odessa Fleming M.D.   On: 04/13/2020 23:07    ____________________________________________   PROCEDURES and INTERVENTIONS  Procedure(s) performed (including Critical Care):  .1-3 Lead EKG Interpretation Performed by: Delton Prairie, MD Authorized by: Delton Prairie, MD     Interpretation: normal     ECG rate:  62   ECG rate assessment: normal      Rhythm: sinus rhythm     Ectopy: none     Conduction: normal      Medications - No data to display  ____________________________________________   MDM / ED COURSE  54 year old woman with strong family history of strokes presents with subacute neurologic symptoms consistent with a missed stroke requiring medical admission.  Patient slightly hypertensive, otherwise normal vital signs on room air.  Exam without any gross neurologic deficits.  Her gait is slightly slow, but she is able to ambulate independently.  No evidence of distress or other acute pathology on examination.  Her blood work is without acute pathology.  CT head demonstrates evidence of remote stroke.  Very high suspicion that this is likely the source of her new deficits, as family provides a good history of discrete change in her functional status over the last few weeks.  MRI ordered, and does demonstrate evidence of acute stroke.  Will admit the patient to hospitalist medicine for further work-up and management.  ____________________________________________   FINAL CLINICAL IMPRESSION(S) / ED DIAGNOSES  Final diagnoses:  Cerebrovascular accident (CVA) due to other mechanism [facility])  Confusion  Gait abnormality     ED Discharge Orders    None       Rachard Isidro   Note:  This document was prepared using Dragon voice recognition software and may include unintentional dictation errors.   Delton Prairie, MD 04/14/20 [phone]

## 2020-04-13 NOTE — ED Notes (Signed)
Pt on stretcher, VSS. Sister bedside.  Pt oriented to self, and place. Pt disoriented to situation. Flat affect noted.  Pt sister reports "abnormal behavior recently".

## 2020-04-14 ENCOUNTER — Encounter: Payer: Self-pay | Admitting: Internal Medicine

## 2020-04-14 ENCOUNTER — Observation Stay: Payer: Self-pay

## 2020-04-14 ENCOUNTER — Observation Stay (HOSPITAL_BASED_OUTPATIENT_CLINIC_OR_DEPARTMENT_OTHER)
Admit: 2020-04-14 | Discharge: 2020-04-14 | Disposition: A | Discharge status: Home / Self Care | Payer: Self-pay | Attending: Internal Medicine | Admitting: Internal Medicine

## 2020-04-14 DIAGNOSIS — I639 Cerebral infarction, unspecified: Secondary | ICD-10-CM

## 2020-04-14 DIAGNOSIS — G9340 Encephalopathy, unspecified: Secondary | ICD-10-CM

## 2020-04-14 DIAGNOSIS — R011 Cardiac murmur, unspecified: Secondary | ICD-10-CM

## 2020-04-14 DIAGNOSIS — I6389 Other cerebral infarction: Secondary | ICD-10-CM

## 2020-04-14 LAB — CBC WITH DIFFERENTIAL/PLATELET
Abs Immature Granulocytes: 0.02 10*3/uL (ref 0.00–0.07)
Basophils Absolute: 0.1 10*3/uL (ref 0.0–0.1)
Basophils Relative: 1 %
Eosinophils Absolute: 0.2 10*3/uL (ref 0.0–0.5)
Eosinophils Relative: 3 %
HCT: 43.3 % (ref 36.0–46.0)
Hemoglobin: 14.9 g/dL (ref 12.0–15.0)
Immature Granulocytes: 0 %
Lymphocytes Relative: 41 %
Lymphs Abs: 3.3 10*3/uL (ref 0.7–4.0)
MCH: 30.3 pg (ref 26.0–34.0)
MCHC: 34.4 g/dL (ref 30.0–36.0)
MCV: 88 fL (ref 80.0–100.0)
Monocytes Absolute: 0.6 10*3/uL (ref 0.1–1.0)
Monocytes Relative: 7 %
Neutro Abs: 4 10*3/uL (ref 1.7–7.7)
Neutrophils Relative %: 48 %
Platelets: 254 10*3/uL (ref 150–400)
RBC: 4.92 MIL/uL (ref 3.87–5.11)
RDW: 13.7 % (ref 11.5–15.5)
WBC: 8.2 10*3/uL (ref 4.0–10.5)
nRBC: 0 % (ref 0.0–0.2)

## 2020-04-14 LAB — ECHOCARDIOGRAM COMPLETE
AR max vel: 2.27 cm2
AV Area VTI: 2.32 cm2
AV Area mean vel: 2.36 cm2
AV Mean grad: 5 mmHg
AV Peak grad: 11 mmHg
Ao pk vel: 1.66 m/s
Area-P 1/2: 2.56 cm2
Height: 65 in
S' Lateral: 2.72 cm
Weight: 4398.62 oz

## 2020-04-14 LAB — BLOOD GAS, VENOUS
Acid-Base Excess: 0.7 mmol/L (ref 0.0–2.0)
Bicarbonate: 25.4 mmol/L (ref 20.0–28.0)
O2 Saturation: 97.1 %
Patient temperature: 37
pCO2, Ven: 40 mmHg — ABNORMAL LOW (ref 44.0–60.0)
pH, Ven: 7.41 (ref 7.250–7.430)
pO2, Ven: 91 mmHg — ABNORMAL HIGH (ref 32.0–45.0)

## 2020-04-14 LAB — COMPREHENSIVE METABOLIC PANEL
ALT: 14 U/L (ref 0–44)
AST: 20 U/L (ref 15–41)
Albumin: 3.5 g/dL (ref 3.5–5.0)
Alkaline Phosphatase: 51 U/L (ref 38–126)
Anion gap: 10 (ref 5–15)
BUN: 11 mg/dL (ref 6–20)
CO2: 22 mmol/L (ref 22–32)
Calcium: 8.8 mg/dL — ABNORMAL LOW (ref 8.9–10.3)
Chloride: 110 mmol/L (ref 98–111)
Creatinine, Ser: 0.71 mg/dL (ref 0.44–1.00)
GFR, Estimated: >60 mL/min (ref >60)
Glucose, Bld: 99 mg/dL (ref 70–99)
Potassium: 3.7 mmol/L (ref 3.5–5.1)
Sodium: 142 mmol/L (ref 135–145)
Total Bilirubin: 0.6 mg/dL (ref 0.3–1.2)
Total Protein: 6.7 g/dL (ref 6.5–8.1)

## 2020-04-14 LAB — AMMONIA: Ammonia: 34 umol/L (ref 9–35)

## 2020-04-14 LAB — LIPID PANEL
Cholesterol: 170 mg/dL (ref 0–200)
HDL: 46 mg/dL (ref >40)
LDL Cholesterol: 112 mg/dL — ABNORMAL HIGH (ref 0–99)
Total CHOL/HDL Ratio: 3.7 RATIO
Triglycerides: 60 mg/dL (ref <150)
VLDL: 12 mg/dL (ref 0–40)

## 2020-04-14 LAB — RESPIRATORY PANEL BY RT PCR (FLU A&B, COVID)
Influenza A by PCR: NEGATIVE
Influenza B by PCR: NEGATIVE
SARS Coronavirus 2 by RT PCR: NEGATIVE

## 2020-04-14 LAB — FERRITIN: Ferritin: 155 ng/mL (ref 11–307)

## 2020-04-14 LAB — RPR: RPR Ser Ql: NONREACTIVE

## 2020-04-14 LAB — PHOSPHORUS: Phosphorus: 3.5 mg/dL (ref 2.5–4.6)

## 2020-04-14 LAB — HIV ANTIBODY (ROUTINE TESTING W REFLEX): HIV Screen 4th Generation wRfx: NONREACTIVE

## 2020-04-14 LAB — MAGNESIUM: Magnesium: 2.2 mg/dL (ref 1.7–2.4)

## 2020-04-14 IMAGING — CT CT ANGIO NECK
3 of 7 series · 10 of 35 positions shown · IV contrast (APPLIED)
Comparison: No pertinent prior exams are available for comparison.

CLINICAL DATA: Transient ischemic attack (TIA).

EXAM:
CT ANGIOGRAPHY NECK
TECHNIQUE: Multidetector CT imaging of the neck was performed using the
standard protocol during bolus administration of intravenous
contrast. Multiplanar CT image reconstructions and MIPs were
obtained to evaluate the vascular anatomy. Carotid stenosis
measurements (when applicable) are obtained utilizing NASCET
criteria, using the distal internal carotid diameter as the
denominator.
CONTRAST:  75mL OMNIPAQUE IOHEXOL 350 MG/ML SOLN

[Series 4: cta neck · axial · 0.45mm/px · z∈[-225,-153]mm · 2 of 108 slices shown]
[im 36/108  soft-tissue]
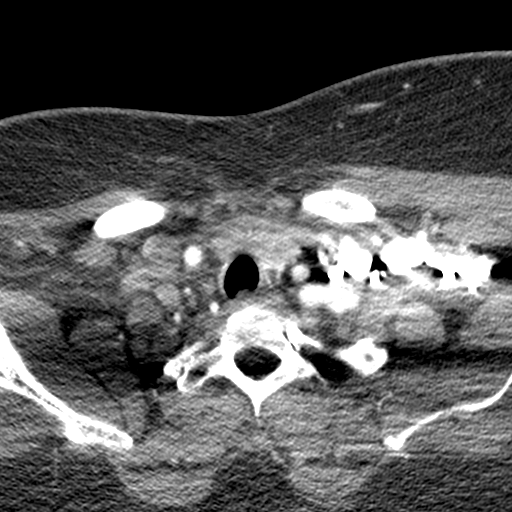
[im 72/108  soft-tissue]
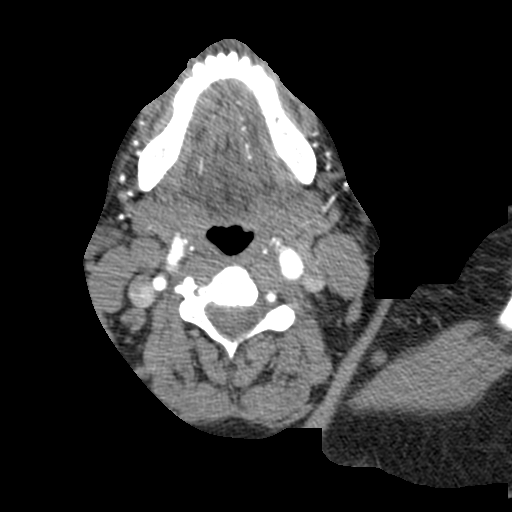

[Series 6: ax thin · axial · 0.38mm/px · z∈[-286,-116]mm · 6 of 241 slices shown]
[im 35/241  soft-tissue]
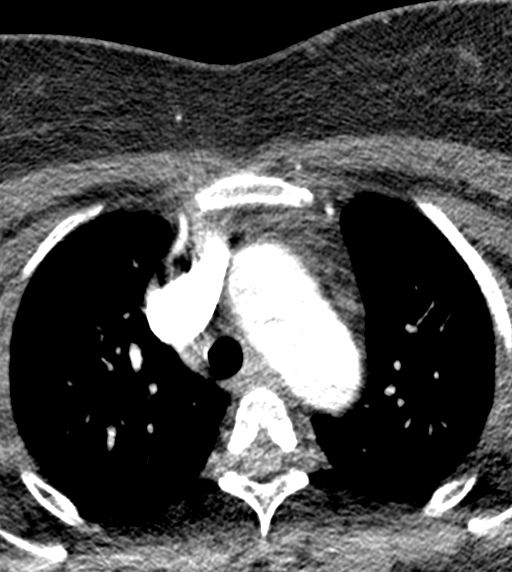
[im 69/241  bone]
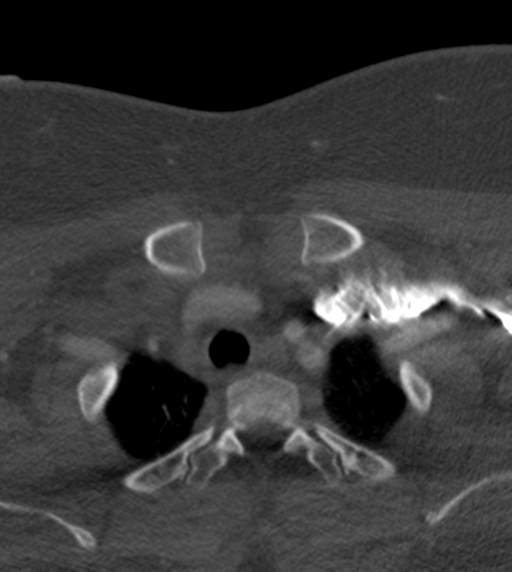
[im 103/241  soft-tissue]
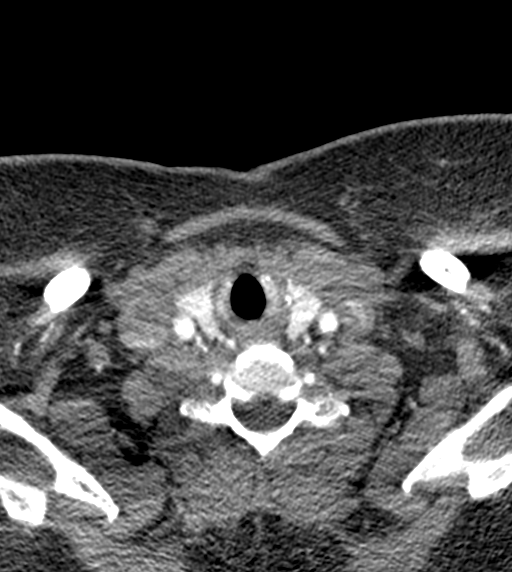
[im 138/241  bone]
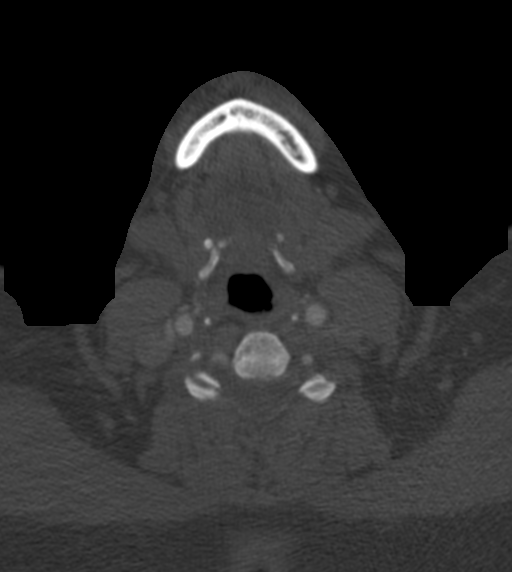
[im 172/241  soft-tissue]
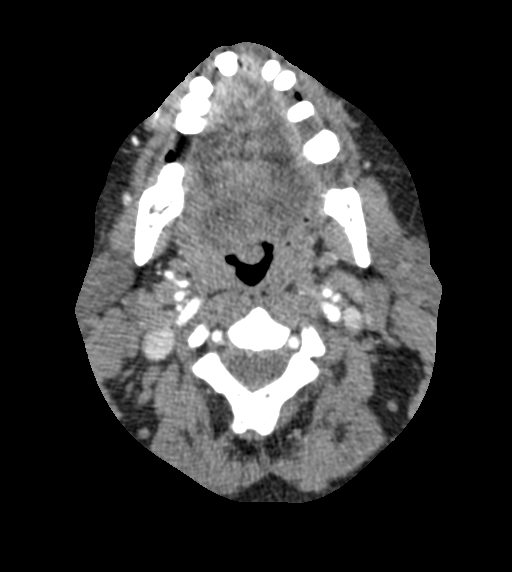
[im 206/241  bone]
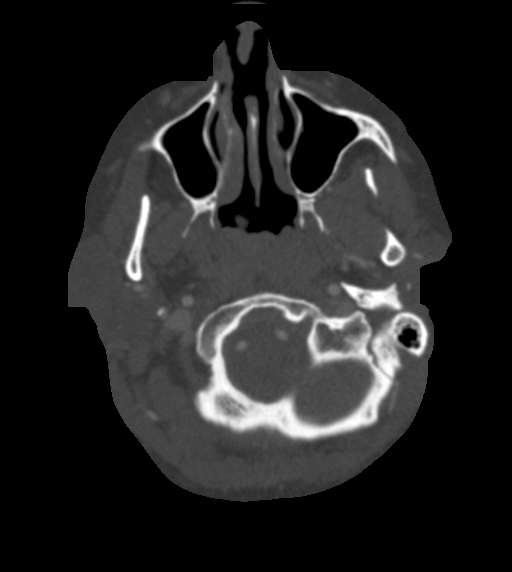

[Series 8: sagittal thin · sagittal · 0.43mm/px · 2 of 196 slices shown]
[im 60/196  soft-tissue]
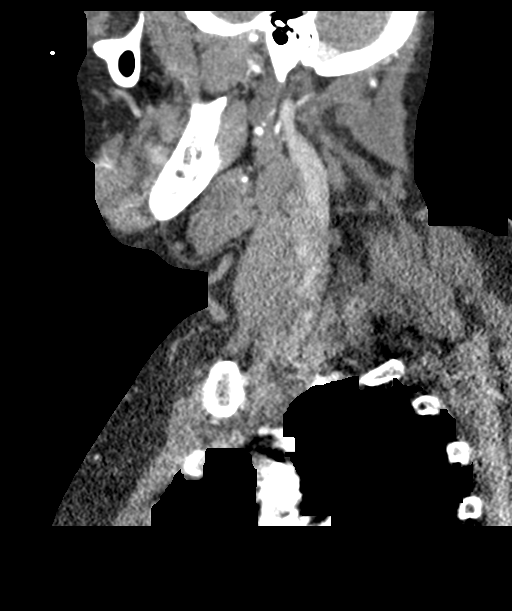
[im 137/196  soft-tissue]
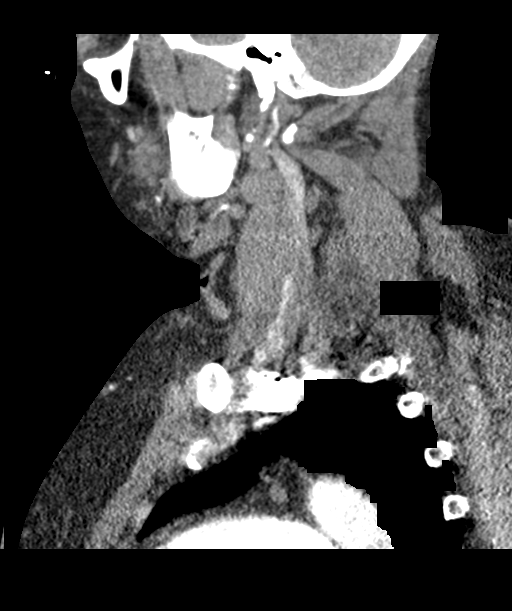

[10 of 35 positions shown; findings below may reference images not displayed]

FINDINGS: Aortic arch: Standard aortic branching. Streak artifact from a dense
left-sided contrast bolus partially obscures the left subclavian
artery. Within this limitation, no evidence of hemodynamically
significant innominate or proximal subclavian artery stenosis.

Right carotid system: CCA and ICA patent within the neck without
stenosis. No significant atherosclerotic disease.

Left carotid system: CCA and ICA patent within the neck without
stenosis. No significant atherosclerotic disease.

Vertebral arteries: Codominant and patent within the neck without
significant stenosis

Skeleton: No acute bony abnormality or aggressive osseous lesion.
Advanced C5-C6 spondylosis with severe disc space narrowing, a
shallow posterior disc osteophyte complex and uncovertebral
hypertrophy. No appreciable high-grade bony spinal canal or neural
foraminal narrowing.

Other neck: No neck mass or cervical lymphadenopathy.

Upper chest: No consolidation within the imaged lung apices. 5 mm
right upper lobe pulmonary nodule (series 6, image 66).
IMPRESSION: The common carotid, internal carotid and vertebral arteries are
patent within the neck without hemodynamically significant stenosis
and without significant atherosclerotic disease.

Please refer to the recent prior MRA of the head for a description
of intracranial atherosclerotic disease and multifocal intracranial
arterial stenoses.

5 mm right upper lobe pulmonary nodule. No follow-up needed if
patient is low-risk. Non-contrast chest CT can be considered in 12
months if patient is high-risk. This recommendation follows the
consensus statement: Guidelines for Management of Incidental
Pulmonary Nodules Detected on CT Images: From the [HOSPITAL]

## 2020-04-14 IMAGING — MR MR MRA HEAD W/O CM
1 series · 16 of 48 positions shown · non-contrast
Comparison: Brain MRI earlier tonight.  Head CT
COMPARISON: Brain MRI earlier tonight.  Head CT

Addendum:
CLINICAL DATA: 54-year-old female with altered mental status and
acute on chronic ischemia on brain MRI tonight.

EXAM:
MRA HEAD WITHOUT CONTRAST
TECHNIQUE: Angiographic images of the Circle of Willis were obtained using MRA
technique without intravenous contrast.

[Series 5: TOF · axial · 0.5mm · 0.41mm/px · z∈[-127,-31]mm · 16 of 205 slices shown]
[im 1/205]
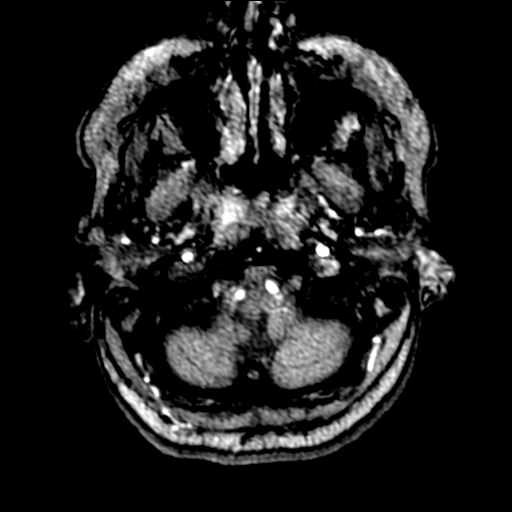
[im 5/205]
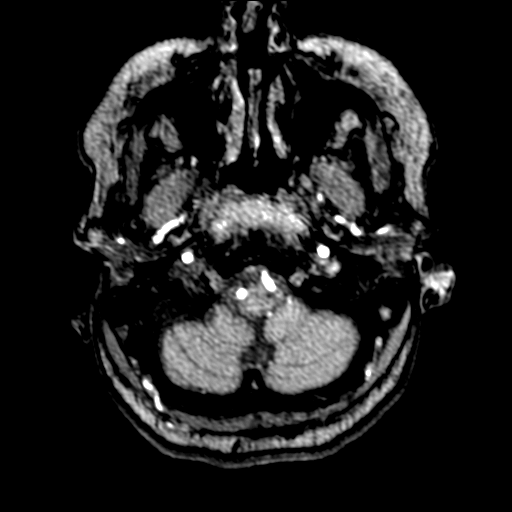
[im 9/205]
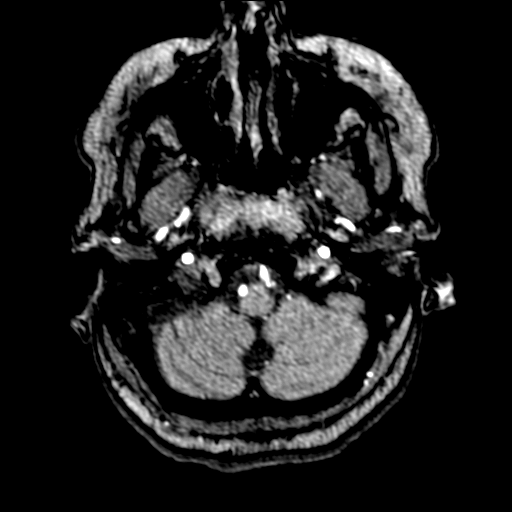
[im 14/205]
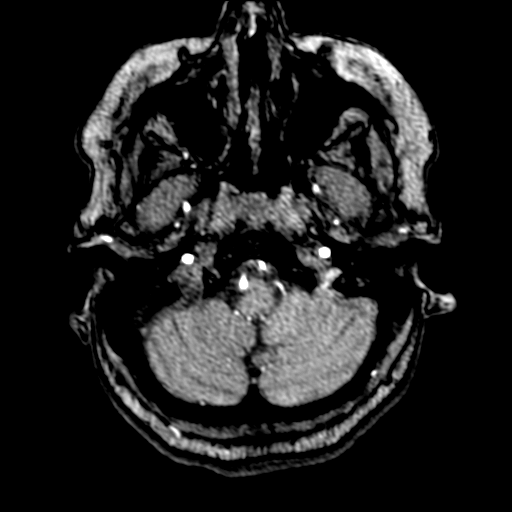
[im 18/205]
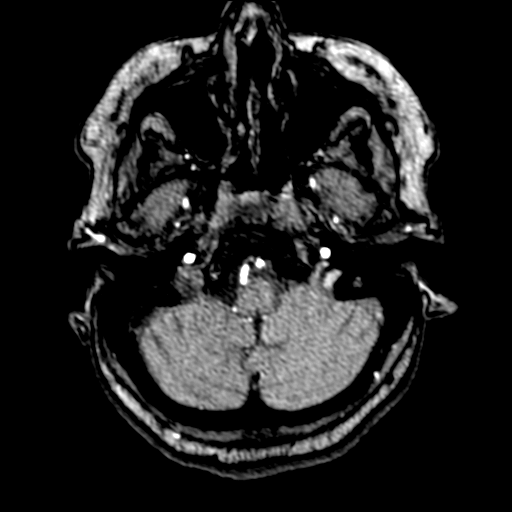
[im 22/205]
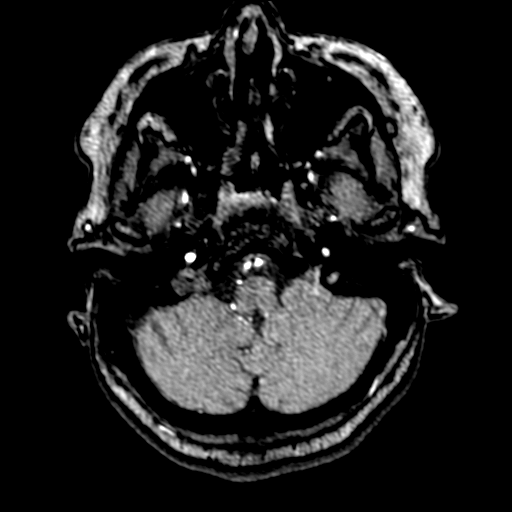
[im 35/205]
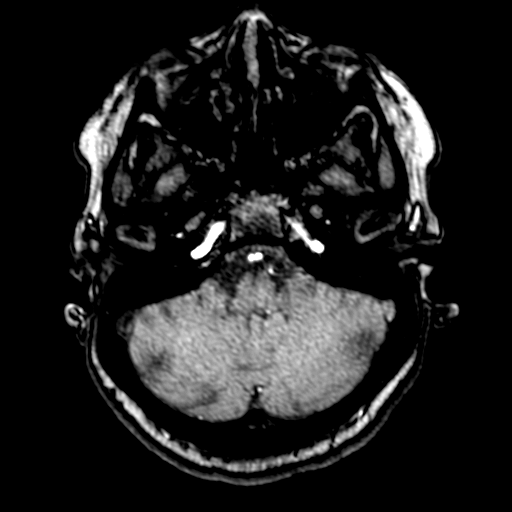
[im 40/205]
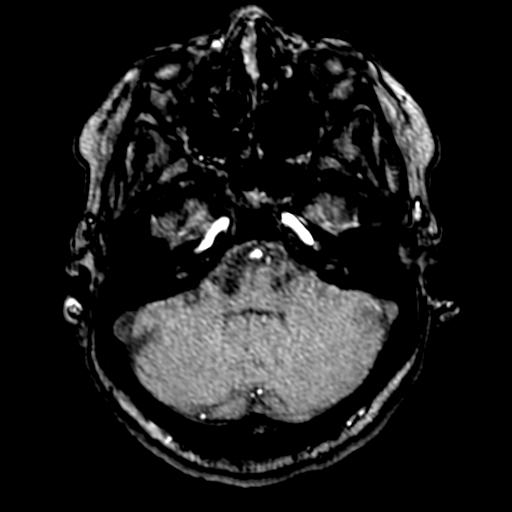
[im 66/205]
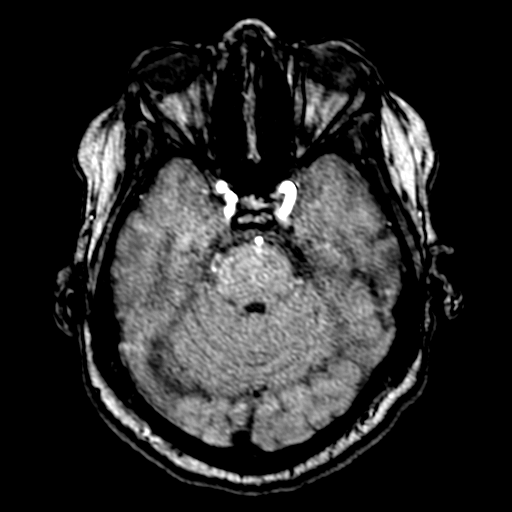
[im 92/205]
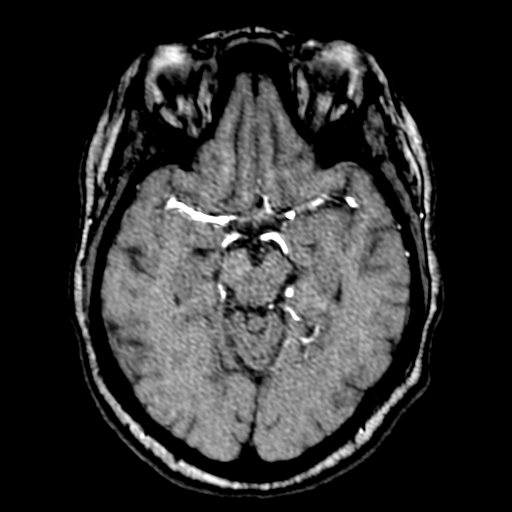
[im 105/205]
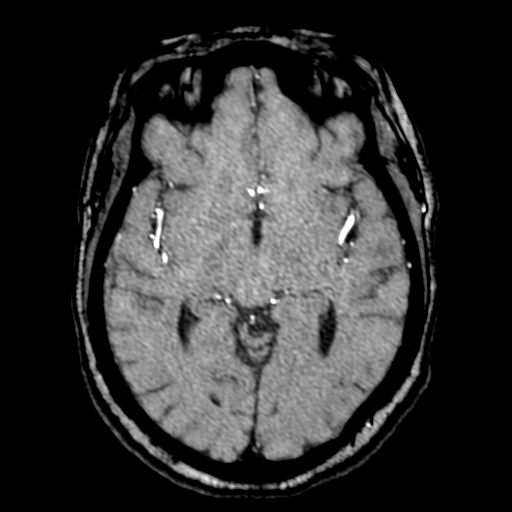
[im 118/205]
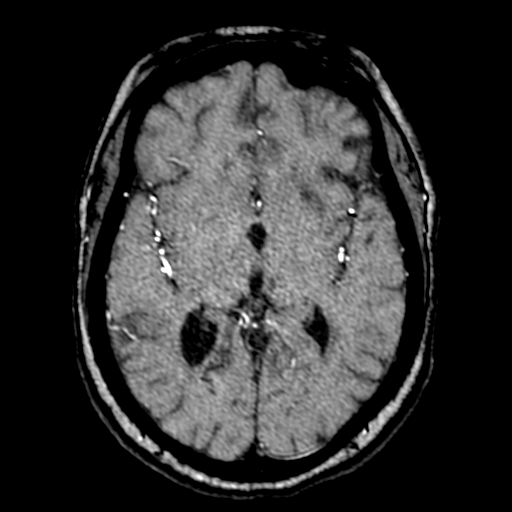
[im 144/205]
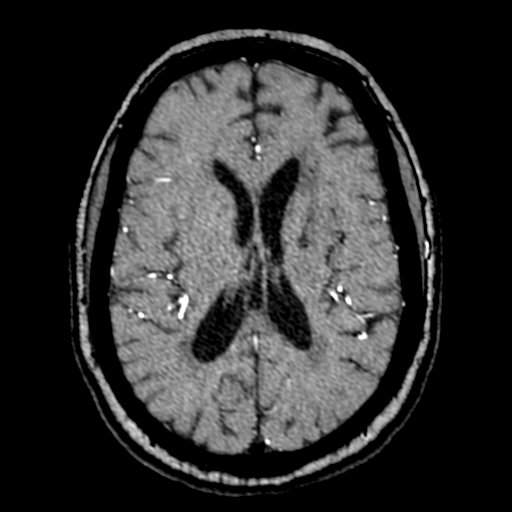
[im 170/205]
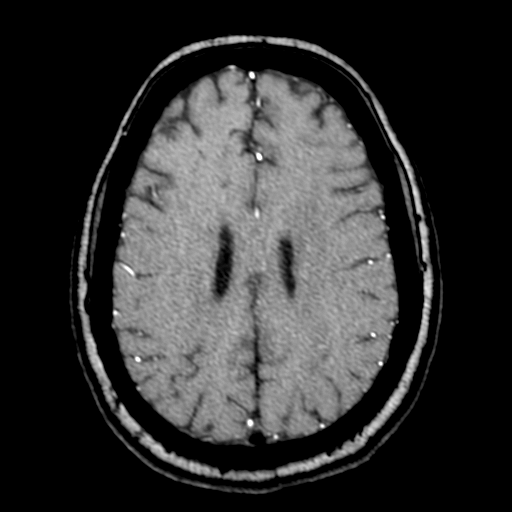
[im 174/205]
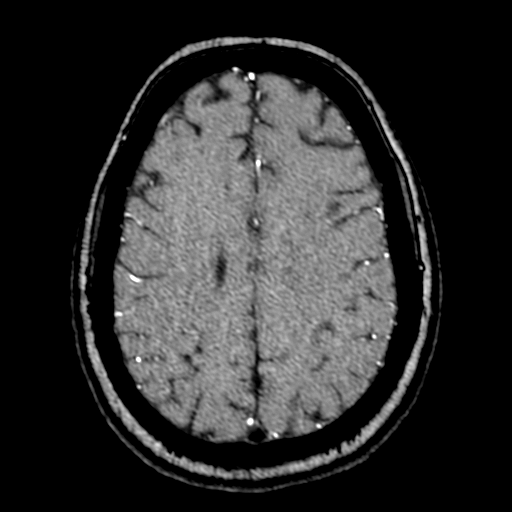
[im 196/205]
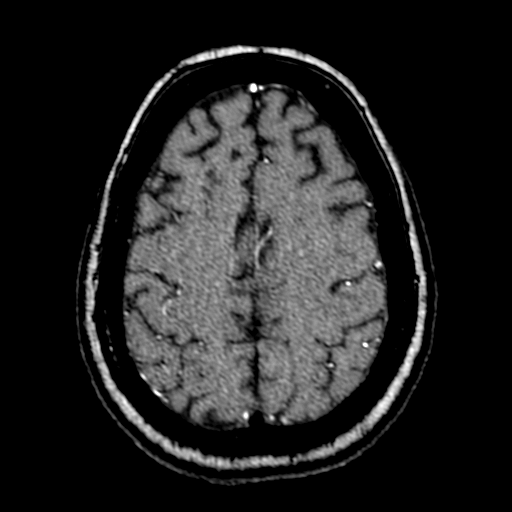

[16 of 48 positions shown; findings below may reference images not displayed]

FINDINGS: Antegrade flow in the posterior circulation. Fairly codominant
distal vertebral arteries are patent to the basilar, with mildly
fenestrated vertebrobasilar junction (normal variant). There is
distal vertebral irregularity suggesting atherosclerosis, but no
significant distal vertebral stenosis. Both PICA origins remain
patent.

Patent basilar artery with mild irregularity, no stenosis. Patent
SCA and PCA origins. Right posterior communicating artery is
present, the left is diminutive or absent.

Left PCA branches are within normal limits. There is multifocal
moderate irregularity and stenosis of the right PCA (series [X6],
image 14) which remains patent.

Antegrade flow in both ICA siphons. Probable artifactual loss of
flow signal in the a petrous left ICA from asymmetric left petrous
apex air cell susceptibility. The right ICA siphon is patent with
mild irregularity. The left siphon is patent but with severe
supraclinoid stenosis including a small flow gap of the distal ICA
(series [X6], image 17).

Carotid termini remain patent. MCA and ACA origins are patent. The
left A1 appears dominant. Anterior communicating artery is within
normal limits. The right ACA A2 segment is occluded just distal to
the A-comm (series [X6], image 12). And the left A2/A3 is patent but
with moderate to severe attenuation of the vessel throughout.

Left MCA M1 segment is mildly irregular without stenosis. Left MCA
bifurcation and visible left MCA branches are within normal limits.
Right MCA M1 is patent with mild irregularity. There is moderate
stenosis at the right anterior temporal artery origin. Right MCA
bifurcation is patent without stenosis. Possible moderate
irregularity and stenosis of visible right MCA M3 branches.
IMPRESSION: 1. Occluded Right ACA in the proximal A2 segment.

2. Positive also for evidence of widespread severe intracranial
atherosclerosis with hemodynamically significant stenoses:
- Severe stenosis distal Left ICA.
- patent Left ACA moderately to severely attenuated throughout.
- Moderate multifocal stenoses Right PCA.

3. Recommend Neuro endovascular consultation regarding the distal
Left ICA stenosis (#2).

ADDENDUM:
Study discussed by telephone with Dr. NEYSSER on [DATE] at [DATE]
.

*** End of Addendum ***
FINDINGS: Antegrade flow in the posterior circulation. Fairly codominant
distal vertebral arteries are patent to the basilar, with mildly
fenestrated vertebrobasilar junction (normal variant). There is
distal vertebral irregularity suggesting atherosclerosis, but no
significant distal vertebral stenosis. Both PICA origins remain
patent.

Patent basilar artery with mild irregularity, no stenosis. Patent
SCA and PCA origins. Right posterior communicating artery is
present, the left is diminutive or absent.

Left PCA branches are within normal limits. There is multifocal
moderate irregularity and stenosis of the right PCA (series [X6],
image 14) which remains patent.

Antegrade flow in both ICA siphons. Probable artifactual loss of
flow signal in the a petrous left ICA from asymmetric left petrous
apex air cell susceptibility. The right ICA siphon is patent with
mild irregularity. The left siphon is patent but with severe
supraclinoid stenosis including a small flow gap of the distal ICA
(series [X6], image 17).

Carotid termini remain patent. MCA and ACA origins are patent. The
left A1 appears dominant. Anterior communicating artery is within
normal limits. The right ACA A2 segment is occluded just distal to
the A-comm (series [X6], image 12). And the left A2/A3 is patent but
with moderate to severe attenuation of the vessel throughout.

Left MCA M1 segment is mildly irregular without stenosis. Left MCA
bifurcation and visible left MCA branches are within normal limits.
Right MCA M1 is patent with mild irregularity. There is moderate
stenosis at the right anterior temporal artery origin. Right MCA
bifurcation is patent without stenosis. Possible moderate
irregularity and stenosis of visible right MCA M3 branches.
IMPRESSION: 1. Occluded Right ACA in the proximal A2 segment.

2. Positive also for evidence of widespread severe intracranial
atherosclerosis with hemodynamically significant stenoses:
- Severe stenosis distal Left ICA.
- patent Left ACA moderately to severely attenuated throughout.
- Moderate multifocal stenoses Right PCA.

3. Recommend Neuro endovascular consultation regarding the distal
Left ICA stenosis (#2).

## 2020-04-14 IMAGING — US US CAROTID DUPLEX BILAT
1 series · 13 of 24 positions shown · non-contrast
Comparison: [DATE] CTA neck

CLINICAL DATA: TIA symptoms, tobacco use

EXAM:
BILATERAL CAROTID DUPLEX ULTRASOUND
TECHNIQUE: Gray scale imaging, color Doppler and duplex ultrasound were
performed of bilateral carotid and vertebral arteries in the neck.

[Series 1: us carotid bilateral · 13 of 64 slices shown]
[im 1/64]
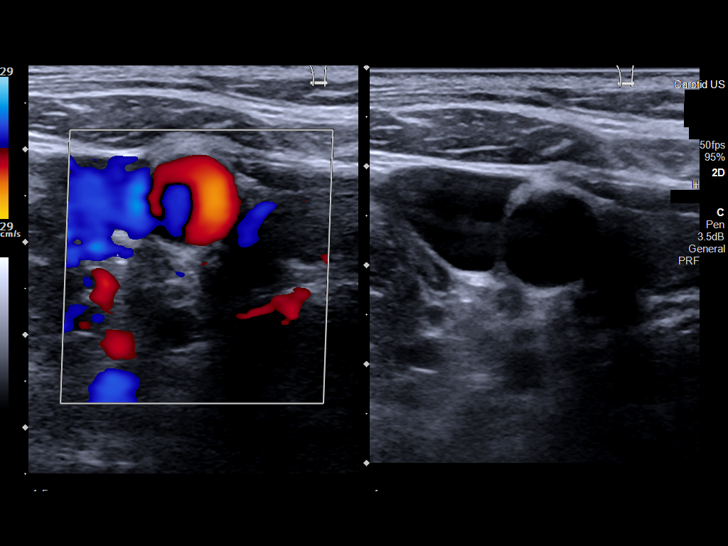
[im 6/64]
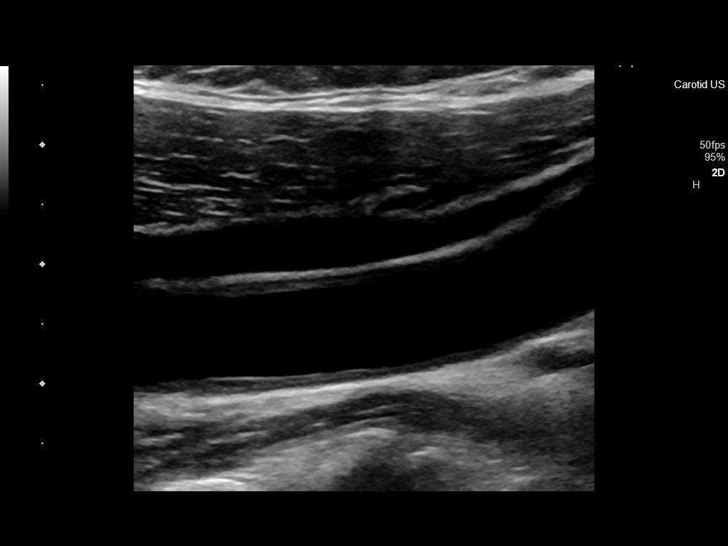
[im 11/64]
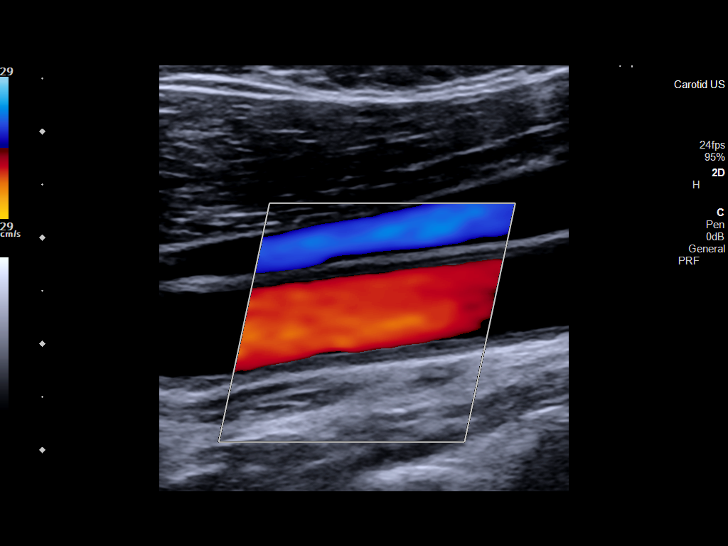
[im 17/64]
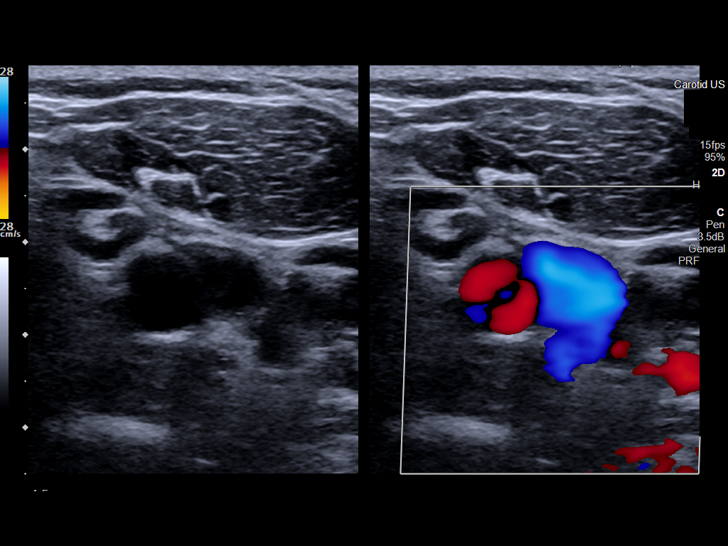
[im 22/64]
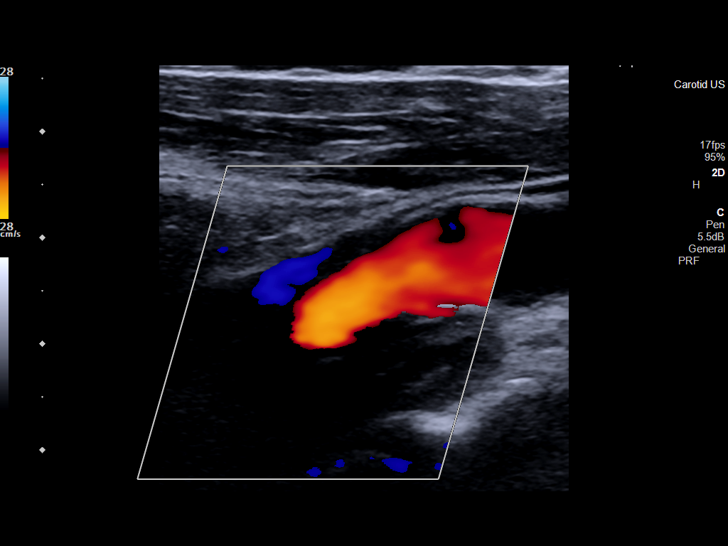
[im 28/64]
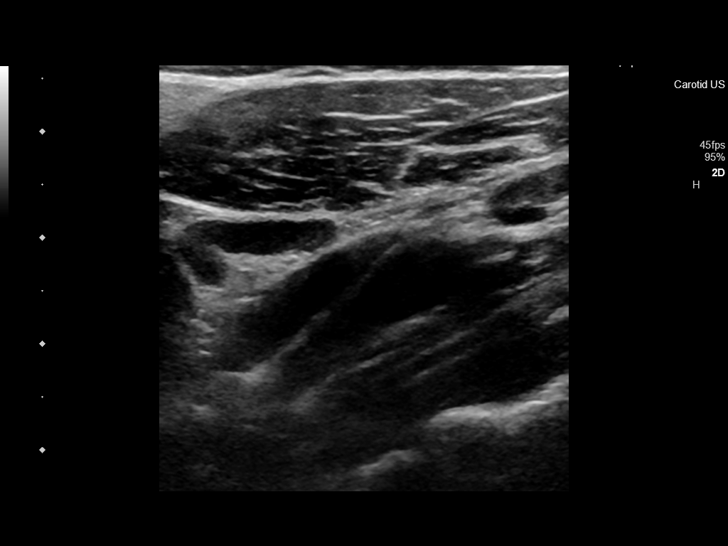
[im 33/64]
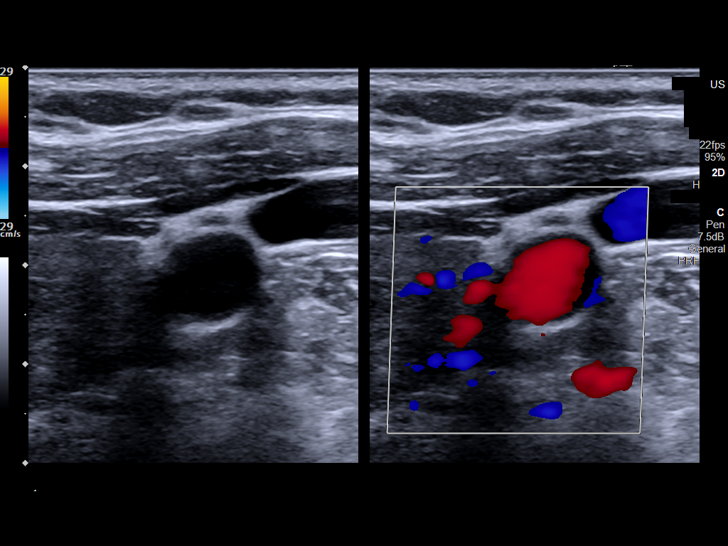
[im 36/64]
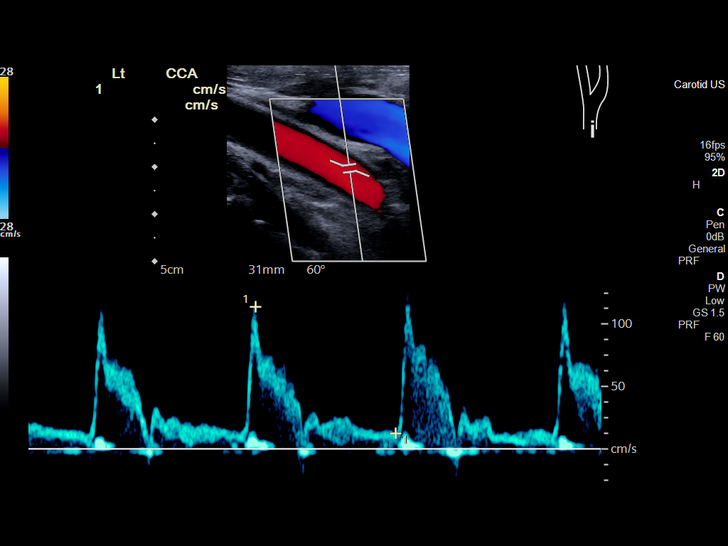
[im 42/64]
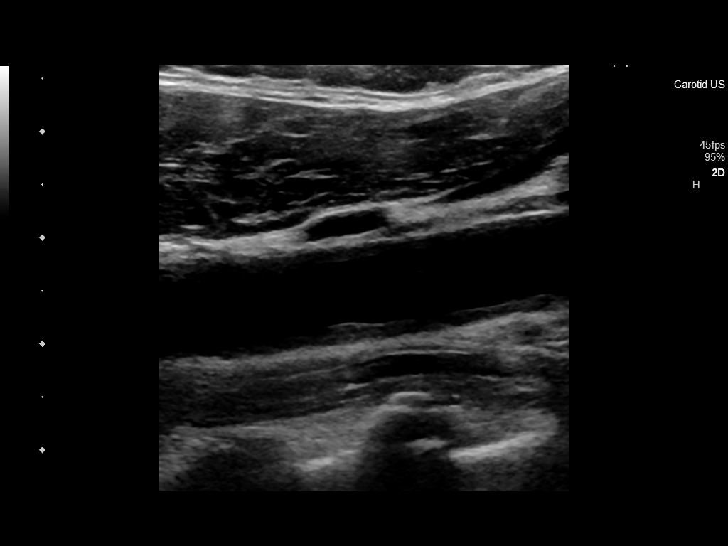
[im 47/64]
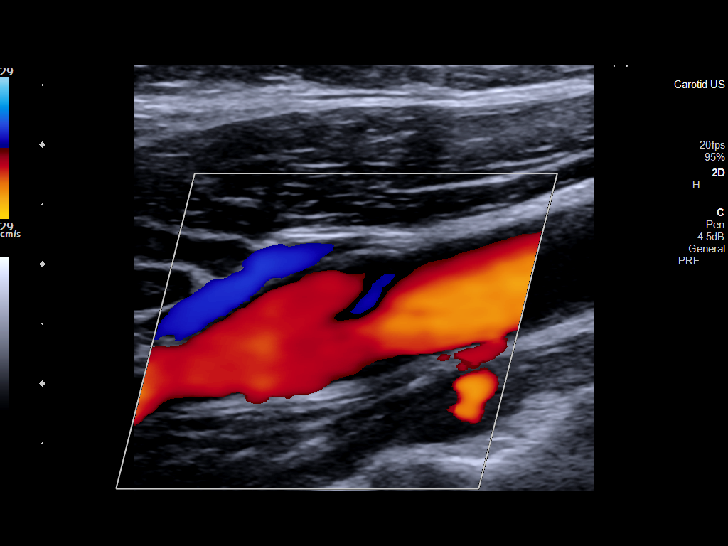
[im 53/64]
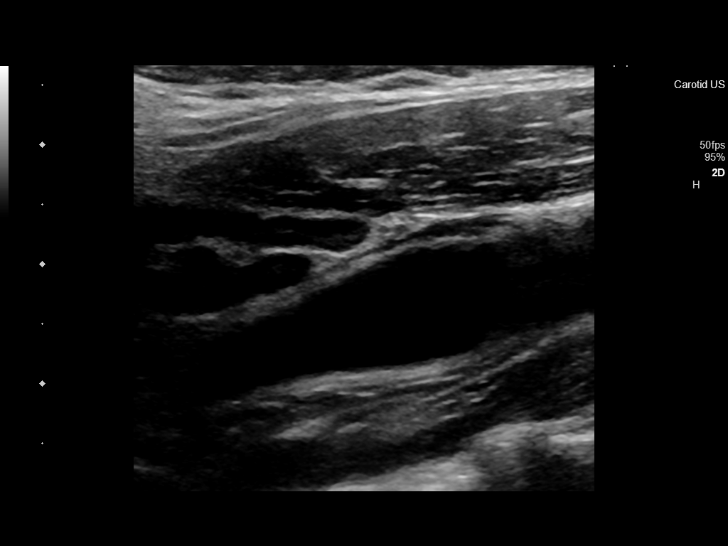
[im 58/64]
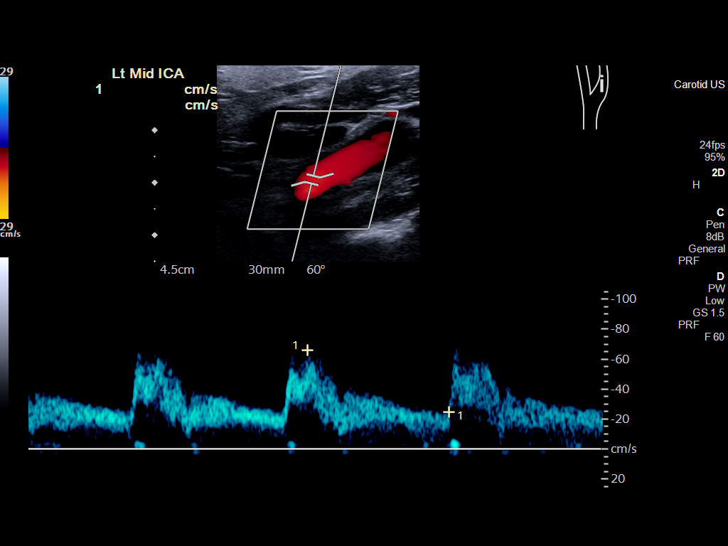
[im 64/64]
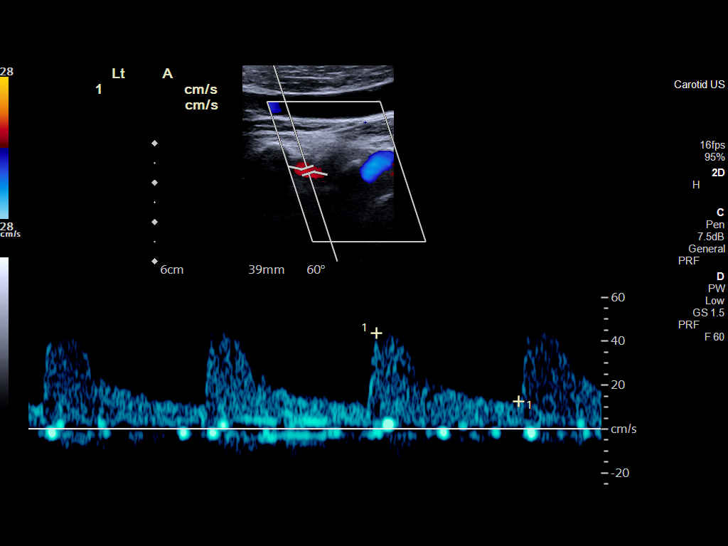

[13 of 24 positions shown; findings below may reference images not displayed]

FINDINGS: Criteria: Quantification of carotid stenosis is based on velocity
parameters that correlate the residual internal carotid diameter
with NASCET-based stenosis levels, using the diameter of the distal
internal carotid lumen as the denominator for stenosis measurement.

The following velocity measurements were obtained:

RIGHT

ICA: 80/32 cm/sec

CCA: 71/18 cm/sec

SYSTOLIC ICA/CCA RATIO:

ECA: 76 cm/sec

LEFT

ICA: 66/25 cm/sec

CCA: 77/13 cm/sec

SYSTOLIC ICA/CCA RATIO:

ECA: 61 cm/sec

RIGHT CAROTID ARTERY: Minor intimal thickening without significant
atherosclerosis or plaque formation. No hemodynamically significant
right ICA stenosis, velocity elevation, turbulent flow. Degree of
narrowing less than 50% by ultrasound criteria.

RIGHT VERTEBRAL ARTERY:  Normal antegrade flow

LEFT CAROTID ARTERY: Similar intimal thickening without significant
atherosclerosis. No hemodynamically significant left ICA stenosis,
velocity elevation, turbulent flow. Degree of narrowing also less
than 50% by ultrasound criteria.

LEFT VERTEBRAL ARTERY:  Normal antegrade flow
IMPRESSION: Minor carotid intimal thickening without significant atherosclerotic
change or calcification. No hemodynamically significant ICA
stenosis. Degree of narrowing less than 50% bilaterally by
ultrasound criteria.

Patent antegrade vertebral flow bilaterally

## 2020-04-14 MED ORDER — ATORVASTATIN CALCIUM 20 MG PO TABS
80.0000 mg | ORAL_TABLET | Freq: Every day | ORAL | Status: DC
  Administered 2020-04-14 – 2020-04-15 (×2): 80 mg via ORAL
  Filled 2020-04-14 (×2): qty 4

## 2020-04-14 MED ORDER — ACETAMINOPHEN 325 MG PO TABS: 650.0000 mg | ORAL_TABLET | Freq: Four times a day (QID) | ORAL | Status: DC | PRN

## 2020-04-14 MED ORDER — IOHEXOL 350 MG/ML SOLN
75.0000 mL | Freq: Once | INTRAVENOUS | Status: AC | PRN
  Administered 2020-04-14: 12:00:00 75 mL via INTRAVENOUS

## 2020-04-14 MED ORDER — CLOPIDOGREL BISULFATE 75 MG PO TABS
75.0000 mg | ORAL_TABLET | Freq: Every day | ORAL | Status: DC
  Administered 2020-04-14 – 2020-04-15 (×2): 75 mg via ORAL
  Filled 2020-04-14 (×2): qty 1

## 2020-04-14 MED ORDER — ONDANSETRON HCL 4 MG/2ML IJ SOLN: 4.0000 mg | Freq: Four times a day (QID) | INTRAMUSCULAR | Status: DC | PRN

## 2020-04-14 MED ORDER — SODIUM CHLORIDE 0.9 % IV SOLN
75.0000 mL/h | INTRAVENOUS | Status: AC
  Administered 2020-04-14: 09:00:00 75 mL/h via INTRAVENOUS

## 2020-04-14 MED ORDER — ONDANSETRON HCL 4 MG PO TABS: 4.0000 mg | ORAL_TABLET | Freq: Four times a day (QID) | ORAL | Status: DC | PRN

## 2020-04-14 MED ORDER — STROKE: EARLY STAGES OF RECOVERY BOOK: Freq: Once | Status: AC

## 2020-04-14 MED ORDER — ENOXAPARIN SODIUM 60 MG/0.6ML ~~LOC~~ SOLN
0.5000 mg/kg | SUBCUTANEOUS | Status: DC
  Filled 2020-04-14: qty 1.2

## 2020-04-14 MED ORDER — ACETAMINOPHEN 650 MG RE SUPP: 650.0000 mg | Freq: Four times a day (QID) | RECTAL | Status: DC | PRN

## 2020-04-14 MED ORDER — ENOXAPARIN SODIUM 60 MG/0.6ML ~~LOC~~ SOLN
60.0000 mg | SUBCUTANEOUS | Status: DC
  Administered 2020-04-14: 23:00:00 60 mg via SUBCUTANEOUS
  Filled 2020-04-14 (×2): qty 0.6

## 2020-04-14 MED ORDER — ALBUTEROL SULFATE (2.5 MG/3ML) 0.083% IN NEBU: 2.5000 mg | INHALATION_SOLUTION | RESPIRATORY_TRACT | Status: DC | PRN

## 2020-04-14 MED ORDER — ASPIRIN 325 MG PO TABS
325.0000 mg | ORAL_TABLET | Freq: Every day | ORAL | Status: DC
  Administered 2020-04-14 – 2020-04-15 (×2): 325 mg via ORAL
  Filled 2020-04-14 (×2): qty 1

## 2020-04-14 NOTE — Consult Note (Signed)
Referring Physician: Dr. Margo Aye    Chief Complaint: New right ACA territory ischemic infarction  HPI: Rebekah Davenport is an 54 y.o. female who presented to the ED on Monday afternoon with subacute behavioral changes and memory loss. She had recently become more forgetful at home and began urinating on herself. She had exhibited a change to her gait over the past few weeks and was no longer functioning at baseline, forgetting multiple tasks that she is accustomed to. On arrival to the ED she was alert and oriented self and place, but disoriented to situation with flat affect. She had no complaints.  She was mildly hypertensive in the ED. Her exam did not reveal any gross neurologic deficits. An MRI was obtained revealing a subacute right ACA territory ischemic infarction which was small in size; extensive chronic microvascular ischemic changes were noted.   Labs are essentially unremarkable from a Neurology standpoint.   The patient states that she has no symptoms and does not know why she came to the hospital, other than that her family felt that she was not acting normally. The patient does admit to having been incontinent of urine and she does state that this is not normal for her.   MRI brain: 1. Small acute on chronic right ACA territory infarct. Underlying chronic blood products but no acute hemorrhage or mass effect. 2. Superimposed severe signal changes affecting the bilateral cerebral white matter, bilateral deep gray matter, and pons most suggestive of advanced small vessel ischemic disease. Relative sparing of the corpus callosum. 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 Several small chronic infarcts also in the cerebellum.  MRA head: 1. 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 2. Positive also for evidence of widespread severe intracranial atherosclerosis with hemodynamically significant stenoses: - Severe stenosis distal Left ICA. - patent Left ACA moderately  to severely attenuated throughout. - Moderate multifocal stenoses Right PCA. 3. Recommend Neuro endovascular consultation regarding the distal Left ICA stenosis (#2).  EKG: Normal sinus rhythm Nonspecific ST and T wave abnormality Abnormal ECG  Carotid ultrasound and TTE: Pending   Past Medical History:  Diagnosis Date  . Asthma   . Heart murmur     Past Surgical History:  Procedure Laterality Date  . CYST EXCISION     upper back  . PARTIAL HYSTERECTOMY      Family History  Problem Relation Age of Onset  . Cancer - Cervical Mother   . Hyperlipidemia Father   . Hypertension Father    Social History:  reports that she has been smoking cigarettes. She has a 17.50 pack-year smoking history. She has never used smokeless tobacco. She reports that she does not drink alcohol and does not use drugs.  Allergies: No Known Allergies  Medications:  No medications prior to admission.   ROS: As per HPI. Comprehensive ROS otherwise negative.   Physical Examination: Blood pressure 139/67, pulse 60, temperature 98 F (36.7 C), resp. rate 17, height 5\' 5"  (1.651 m), weight 124.7 kg, SpO2 99 %.  HEENT:Fraser/AT Lungs: Respirations unlabored Ext: No edema  Neurologic Examination: Mental Status: Alert and fully oriented. Exhibits a dysthymic affect and is somewhat noncooperative. Speech fluent with intact comprehension. Able to follow all commands without difficulty. Cranial Nerves: II:  Visual fields grossly intact without extinction to DSS. PERRL.  III,IV, VI: No ptosis. EOMI. No nystagmus.  V,VII: Smile symmetric, facial temp sensation equal bilaterally VIII: Hearing intact to voice IX,X: No hypophonia XI: Symmetric  XII: Midline tongue extension  Motor: Right : Upper extremity   5/5    Left:     Upper extremity   5/5  Lower extremity   5/5     Lower extremity   5/5 No pronator drift  Sensory: Temp and light touch sensation intact throughout, bilaterally Deep Tendon  Reflexes:  2+ bilateral brachioradialis and patellae Cerebellar: No ataxia with FNF bilaterally  Gait: Deferred due to falls risk concerns  Results for orders placed or performed during the hospital encounter of 04/13/20 (from the past 48 hour(s))  Comprehensive metabolic panel     Status: Abnormal   Collection Time: 04/13/20  4:35 PM  Result Value Ref Range   Sodium 139 135 - 145 mmol/L   Potassium 3.6 3.5 - 5.1 mmol/L   Chloride 106 98 - 111 mmol/L   CO2 26 22 - 32 mmol/L   Glucose, Bld 106 (H) 70 - 99 mg/dL    Comment: Glucose reference range applies only to samples taken after fasting for at least 8 hours.   BUN 12 6 - 20 mg/dL   Creatinine, Ser 8.31 0.44 - 1.00 mg/dL   Calcium 8.9 8.9 - 51.7 mg/dL   Total Protein 7.4 6.5 - 8.1 g/dL   Albumin 3.9 3.5 - 5.0 g/dL   AST 20 15 - 41 U/L   ALT 14 0 - 44 U/L   Alkaline Phosphatase 58 38 - 126 U/L   Total Bilirubin 0.6 0.3 - 1.2 mg/dL   GFR, Estimated >61 >60 mL/min   Anion gap 7 5 - 15    Comment: Performed at [facility], [address] [place], [place] [address]  CBC     Status: Abnormal   Collection Time: 04/13/20  4:35 PM  Result Value Ref Range   WBC 8.5 4.0 - 10.5 K/uL   RBC 5.05 3.87 - 5.11 MIL/uL   Hemoglobin 15.2 (H) 12.0 - 15.0 g/dL   HCT 62.6 36 - 46 %   MCV 88.1 80.0 - 100.0 fL   MCH 30.1 26.0 - 34.0 pg   MCHC 34.2 30.0 - 36.0 g/dL   RDW 94.8 54.6 - 27.0 %   Platelets 272 150 - 400 K/uL   nRBC 0.0 0.0 - 0.2 %    Comment: Performed at [facility], [address] [place], [place] [address]  Troponin I (High Sensitivity)     Status: None   Collection Time: 04/13/20  4:35 PM  Result Value Ref Range   Troponin I (High Sensitivity) 6 <18 ng/L    Comment: (NOTE) Elevated high sensitivity troponin I (hsTnI) values and significant  changes across serial measurements may suggest ACS but many other  chronic and acute conditions are known to elevate hsTnI results.  Refer to the "Links" section for  chest pain algorithms and additional  guidance. Performed at [facility], [address] [place], [place] [address]   Urinalysis, Complete w Microscopic     Status: Abnormal   Collection Time: 04/13/20  4:42 PM  Result Value Ref Range   Color, Urine YELLOW (A) YELLOW   APPearance HAZY (A) CLEAR   Specific Gravity, Urine 1.026 1.005 - 1.030   pH 5.0 5.0 - 8.0   Glucose, UA NEGATIVE NEGATIVE mg/dL   Hgb urine dipstick SMALL (A) NEGATIVE   Bilirubin Urine NEGATIVE NEGATIVE   Ketones, ur NEGATIVE NEGATIVE mg/dL   Protein, ur NEGATIVE NEGATIVE mg/dL   Nitrite NEGATIVE NEGATIVE   Leukocytes,Ua NEGATIVE NEGATIVE   RBC / HPF  0-5 0 - 5 RBC/hpf   WBC, UA 0-5 0 - 5 WBC/hpf   Bacteria, UA NONE SEEN NONE SEEN   Squamous Epithelial / LPF 0-5 0 - 5   Mucus PRESENT     Comment: Performed at [facility], [address] [place], [place] [address]  Urine Drug Screen, Qualitative ([facility] only)     Status: Abnormal   Collection Time: 04/13/20  4:42 PM  Result Value Ref Range   Tricyclic, Ur Screen NONE DETECTED NONE DETECTED   Amphetamines, Ur Screen NONE DETECTED NONE DETECTED   MDMA (Ecstasy)Ur Screen NONE DETECTED NONE DETECTED   Cocaine Metabolite,Ur [place] NONE DETECTED NONE DETECTED   Opiate, Ur Screen NONE DETECTED NONE DETECTED   Phencyclidine (PCP) Ur S NONE DETECTED NONE DETECTED   Cannabinoid 50 Ng, Ur [place] POSITIVE (A) NONE DETECTED   Barbiturates, Ur Screen NONE DETECTED NONE DETECTED   Benzodiazepine, Ur Scrn NONE DETECTED NONE DETECTED   Methadone Scn, Ur NONE DETECTED NONE DETECTED    Comment: (NOTE) Tricyclics + metabolites, urine    Cutoff 1000 ng/mL Amphetamines + metabolites, urine  Cutoff 1000 ng/mL MDMA (Ecstasy), urine              Cutoff 500 ng/mL Cocaine Metabolite, urine          Cutoff 300 ng/mL Opiate + metabolites, urine        Cutoff 300 ng/mL Phencyclidine (PCP), urine         Cutoff 25 ng/mL Cannabinoid, urine                 Cutoff 50  ng/mL Barbiturates + metabolites, urine  Cutoff 200 ng/mL Benzodiazepine, urine              Cutoff 200 ng/mL Methadone, urine                   Cutoff 300 ng/mL  The urine drug screen provides only a preliminary, unconfirmed analytical test result and should not be used for non-medical purposes. Clinical consideration and professional judgment should be applied to any positive drug screen result due to possible interfering substances. A more specific alternate chemical method must be used in order to obtain a confirmed analytical result. Gas chromatography / mass spectrometry (GC/MS) is the preferred confirm atory method. Performed at [facility], [address] [place], [place] [address]   CK     Status: None   Collection Time: 04/13/20  4:42 PM  Result Value Ref Range   Total CK 160 38.0 - 234.0 U/L    Comment: Performed at [facility], [address] [place], [place] [address]  Magnesium     Status: None   Collection Time: 04/13/20  4:42 PM  Result Value Ref Range   Magnesium 2.2 1.7 - 2.4 mg/dL    Comment: Performed at [facility], [address] [place], [place] [address]  Ethanol     Status: None   Collection Time: 04/13/20  4:42 PM  Result Value Ref Range   Alcohol, Ethyl (B) <10 <10 mg/dL    Comment: (NOTE) Lowest detectable limit for serum alcohol is 10 mg/dL.  For medical purposes only. Performed at [facility], [address] [place], [place] [address]   CBC with Differential/Platelet     Status: Abnormal   Collection Time: 04/13/20  4:42 PM  Result Value Ref Range   WBC 8.6 4.0 - 10.5 K/uL   RBC 5.13 (H) 3.87 -  5.11 MIL/uL   Hemoglobin 15.4 (H) 12.0 - 15.0 g/dL   HCT 82.9 36 - 46 %   MCV 88.7 80.0 - 100.0 fL   MCH 30.0 26.0 - 34.0 pg   MCHC 33.8 30.0 - 36.0 g/dL   RDW 56.2 13.0 - 86.5 %   Platelets 281 150 - 400 K/uL   nRBC 0.0 0.0 - 0.2 %   Neutrophils Relative % 55 %   Neutro Abs 4.8 1.7 - 7.7 K/uL    Lymphocytes Relative 32 %   Lymphs Abs 2.8 0.7 - 4.0 K/uL   Monocytes Relative 9 %   Monocytes Absolute 0.8 0.1 - 1.0 K/uL   Eosinophils Relative 3 %   Eosinophils Absolute 0.2 0.0 - 0.5 K/uL   Basophils Relative 1 %   Basophils Absolute 0.1 0.0 - 0.1 K/uL   Immature Granulocytes 0 %   Abs Immature Granulocytes 0.01 0.00 - 0.07 K/uL    Comment: Performed at [facility], [address] [place], [place] [address]  TSH     Status: None   Collection Time: 04/13/20  4:42 PM  Result Value Ref Range   TSH 2.740 0.350 - 4.500 uIU/mL    Comment: Performed by a 3rd Generation assay with a functional sensitivity of <=0.01 uIU/mL. Performed at [facility], [address] [place], [place] [address]   Blood gas, venous     Status: Abnormal   Collection Time: 04/13/20 11:11 PM  Result Value Ref Range   pH, Ven 7.41 7.25 - 7.43   pCO2, Ven 40 (L) 44 - 60 mmHg   pO2, Ven 91.0 (H) 32 - 45 mmHg   Bicarbonate 25.4 20.0 - 28.0 mmol/L   Acid-Base Excess 0.7 0.0 - 2.0 mmol/L   O2 Saturation 97.1 %   Patient temperature 37.0    Collection site VEIN    Sample type VENOUS     Comment: Performed at [facility], [address] [place], [place] [address]  Respiratory Panel by RT PCR (Flu A&B, Covid) - Nasopharyngeal Swab     Status: None   Collection Time: 04/13/20 11:11 PM   Specimen: Nasopharyngeal Swab  Result Value Ref Range   SARS Coronavirus 2 by RT PCR NEGATIVE NEGATIVE    Comment: (NOTE) SARS-CoV-2 target nucleic acids are NOT DETECTED.  The SARS-CoV-2 RNA is generally detectable in upper respiratoy specimens during the acute phase of infection. The lowest concentration of SARS-CoV-2 viral copies this assay can detect is 131 copies/mL. A negative result does not preclude SARS-Cov-2 infection and should not be used as the sole basis for treatment or other patient management decisions. A negative result may occur with  improper specimen collection/handling,  submission of specimen other than nasopharyngeal swab, presence of viral mutation(s) within the areas targeted by this assay, and inadequate number of viral copies (<131 copies/mL). A negative result must be combined with clinical observations, patient history, and epidemiological information. The expected result is Negative.  Fact Sheet for Patients:  https://www.moore.com/  Fact Sheet for Healthcare Providers:  https://www.young.biz/  This test is no t yet approved or cleared by the [place] FDA and  has been authorized for detection and/or diagnosis of SARS-CoV-2 by FDA under an Emergency Use Authorization (EUA). This EUA will remain  in effect (meaning this test can be used) for the duration of the COVID-19 declaration under Section 564(b)(1) of the Act, 21 U.S.C. section 360bbb-3(b)(1), unless the authorization is terminated or revoked sooner.  Influenza A by PCR NEGATIVE NEGATIVE   Influenza B by PCR NEGATIVE NEGATIVE    Comment: (NOTE) The Xpert Xpress SARS-CoV-2/FLU/RSV assay is intended as an aid in  the diagnosis of influenza from Nasopharyngeal swab specimens and  should not be used as a sole basis for treatment. Nasal washings and  aspirates are unacceptable for Xpert Xpress SARS-CoV-2/FLU/RSV  testing.  Fact Sheet for Patients: https://www.moore.com/  Fact Sheet for Healthcare Providers: https://www.young.biz/  This test is not yet approved or cleared by the [place] FDA and  has been authorized for detection and/or diagnosis of SARS-CoV-2 by  FDA under an Emergency Use Authorization (EUA). This EUA will remain  in effect (meaning this test can be used) for the duration of the  Covid-19 declaration under Section 564(b)(1) of the Act, 21  U.S.C. section 360bbb-3(b)(1), unless the authorization is  terminated or revoked. Performed at [facility], [address] [place], [place] [address]   Ammonia     Status: None   Collection Time: 04/14/20 12:46 AM  Result Value Ref Range   Ammonia 34 9 - 35 umol/L    Comment: Performed at [facility], [address] [place], [place] [address]  Lipid panel     Status: Abnormal   Collection Time: 04/14/20  5:33 AM  Result Value Ref Range   Cholesterol 170 0 - 200 mg/dL   Triglycerides 60 <034 mg/dL   HDL 46 >91 mg/dL   Total CHOL/HDL Ratio 3.7 RATIO   VLDL 12 0 - 40 mg/dL   LDL Cholesterol 791 (H) 0 - 99 mg/dL    Comment:        Total Cholesterol/HDL:CHD Risk Coronary Heart Disease Risk Table                     Men   Women  1/2 Average Risk   3.4   3.3  Average Risk       5.0   4.4  2 X Average Risk   9.6   7.1  3 X Average Risk  23.4   11.0        Use the calculated Patient Ratio above and the CHD Risk Table to determine the patient's CHD Risk.        ATP III CLASSIFICATION (LDL):  <100     mg/dL   Optimal  505-697  mg/dL   Near or Above                    Optimal  130-159  mg/dL   Borderline  948-016  mg/dL   High  >553     mg/dL   Very High Performed at [facility], [address] [place], [place] [address]   Magnesium     Status: None   Collection Time: 04/14/20  5:33 AM  Result Value Ref Range   Magnesium 2.2 1.7 - 2.4 mg/dL    Comment: Performed at [facility], [address] [place], [place] [address]  Phosphorus     Status: None   Collection Time: 04/14/20  5:33 AM  Result Value Ref Range   Phosphorus 3.5 2.5 - 4.6 mg/dL    Comment: Performed at [facility], [address] [place], [place] [address]  CBC WITH DIFFERENTIAL     Status: None   Collection Time: 04/14/20  5:33 AM  Result Value Ref Range   WBC 8.2 4.0 - 10.5 K/uL   RBC 4.92  3.87 - 5.11 MIL/uL   Hemoglobin 14.9 12.0 - 15.0 g/dL   HCT 16.143.3 36 - 46 %   MCV 88.0 80.0 - 100.0 fL   MCH 30.3 26.0 - 34.0 pg   MCHC 34.4 30.0 - 36.0 g/dL   RDW 09.613.7 04.511.5 - 40.915.5 %   Platelets  254 150 - 400 K/uL   nRBC 0.0 0.0 - 0.2 %   Neutrophils Relative % 48 %   Neutro Abs 4.0 1.7 - 7.7 K/uL   Lymphocytes Relative 41 %   Lymphs Abs 3.3 0.7 - 4.0 K/uL   Monocytes Relative 7 %   Monocytes Absolute 0.6 0.1 - 1.0 K/uL   Eosinophils Relative 3 %   Eosinophils Absolute 0.2 0.0 - 0.5 K/uL   Basophils Relative 1 %   Basophils Absolute 0.1 0.0 - 0.1 K/uL   Immature Granulocytes 0 %   Abs Immature Granulocytes 0.02 0.00 - 0.07 K/uL    Comment: Performed at [facility], [address] [place], [place] [address]  Comprehensive metabolic panel     Status: Abnormal   Collection Time: 04/14/20  5:33 AM  Result Value Ref Range   Sodium 142 135 - 145 mmol/L   Potassium 3.7 3.5 - 5.1 mmol/L   Chloride 110 98 - 111 mmol/L   CO2 22 22 - 32 mmol/L   Glucose, Bld 99 70 - 99 mg/dL    Comment: Glucose reference range applies only to samples taken after fasting for at least 8 hours.   BUN 11 6 - 20 mg/dL   Creatinine, Ser 4.780.71 0.44 - 1.00 mg/dL   Calcium 8.8 (L) 8.9 - 10.3 mg/dL   Total Protein 6.7 6.5 - 8.1 g/dL   Albumin 3.5 3.5 - 5.0 g/dL   AST 20 15 - 41 U/L   ALT 14 0 - 44 U/L   Alkaline Phosphatase 51 38 - 126 U/L   Total Bilirubin 0.6 0.3 - 1.2 mg/dL   GFR, Estimated >29>60 >56>60 mL/min   Anion gap 10 5 - 15    Comment: Performed at [facility], [address] [place], [place] [address]  RPR     Status: None   Collection Time: 04/14/20  5:33 AM  Result Value Ref Range   RPR Ser Ql NON REACTIVE NON REACTIVE    Comment: Performed at [facility] [place] [facility], [address] [place], [place] [address]  Ferritin     Status: None   Collection Time: 04/14/20  5:33 AM  Result Value Ref Range   Ferritin 155 11 - 307 ng/mL    Comment: Performed at [facility], [address] [place], [place] [address]   DG Chest 2 View  Result Date: 04/13/2020 CLINICAL DATA:  Altered mental status EXAM: CHEST - 2 VIEW COMPARISON:  03/14/2016 FINDINGS: Cardiac shadow is  within normal limits. The lungs are well aerated bilaterally. No focal infiltrate or sizable effusion is seen. Calcified granuloma is noted in the right base. No bony abnormality is noted. IMPRESSION: No acute abnormality noted. Electronically Signed   By: Alcide CleverMark  Lukens M.D.   On: 04/13/2020 23:19   CT Head Wo Contrast  Result Date: 04/13/2020 CLINICAL DATA:  Delirium.  Altered mental status EXAM: CT HEAD WITHOUT CONTRAST TECHNIQUE: Contiguous axial images were obtained from the base of the skull through the vertex without intravenous contrast. COMPARISON:  None. FINDINGS: Brain: Hypodensity throughout the frontal white matter bilaterally extending into the internal capsule. This is symmetric and  appears chronic. Milder hypodensity in the parietal white matter bilaterally also appears chronic. Hypodensity right frontal lobe consistent with chronic cortical infarct. Ventricle size normal.  Negative for acute infarct, hemorrhage, mass Vascular: Negative for hyperdense vessel Skull: Negative Sinuses/Orbits: Paranasal sinuses clear.  Negative orbit Other: None IMPRESSION: Diffuse white matter changes. While nonspecific, this is likely due to chronic microvascular ischemic change. Correlate with risk factors. Chronic appearing infarct right frontal lobe No acute abnormality. Electronically Signed   By: Marlan Palau M.D.   On: 04/13/2020 17:04   MR ANGIO HEAD WO CONTRAST  Addendum Date: 04/14/2020   ADDENDUM REPORT: 04/14/2020 02:17 ADDENDUM: Study discussed by telephone with Dr. Para March on 04/14/2020 at 02:17 . Electronically Signed   By: Odessa Fleming M.D.   On: 04/14/2020 02:17   Result Date: 04/14/2020 CLINICAL DATA:  53 year old female with altered mental status and acute on chronic ischemia on brain MRI tonight. EXAM: MRA HEAD WITHOUT CONTRAST TECHNIQUE: Angiographic images of the Circle of Willis were obtained using MRA technique without intravenous contrast. COMPARISON:  Brain MRI earlier tonight.  Head CT  FINDINGS: Antegrade flow in the posterior circulation. Fairly codominant distal vertebral arteries are patent to the basilar, with mildly fenestrated vertebrobasilar junction (normal variant). There is distal vertebral irregularity suggesting atherosclerosis, but no significant distal vertebral stenosis. Both PICA origins remain patent. Patent basilar artery with mild irregularity, no stenosis. Patent SCA and PCA origins. Right posterior communicating artery is present, the left is diminutive or absent. Left PCA branches are within normal limits. There is multifocal moderate irregularity and stenosis of the right PCA (series 1043, image 14) which remains patent. Antegrade flow in both ICA siphons. Probable artifactual loss of flow signal in the a petrous left ICA from asymmetric left petrous apex air cell susceptibility. The right ICA siphon is patent with mild irregularity. The left siphon is patent but with severe supraclinoid stenosis including a small flow gap of the distal ICA (series 1053, image 17). Carotid termini remain patent. MCA and ACA origins are patent. The left A1 appears dominant. Anterior communicating artery is within normal limits. The right ACA A2 segment is occluded just distal to the A-comm (series 1053, image 12). And the left A2/A3 is patent but with moderate to severe attenuation of the vessel throughout. Left MCA M1 segment is mildly irregular without stenosis. Left MCA bifurcation and visible left MCA branches are within normal limits. Right MCA M1 is patent with mild irregularity. There is moderate stenosis at the right anterior temporal artery origin. Right MCA bifurcation is patent without stenosis. Possible moderate irregularity and stenosis of visible right MCA M3 branches. IMPRESSION: 1. 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 2. Positive also for evidence of widespread severe intracranial atherosclerosis with hemodynamically significant stenoses: - Severe stenosis distal  Left ICA. - patent Left ACA moderately to severely attenuated throughout. - Moderate multifocal stenoses Right PCA. 3. Recommend Neuro endovascular consultation regarding the distal Left ICA stenosis (#2). Electronically Signed: By: Odessa Fleming M.D. On: 04/14/2020 02:04   MR BRAIN WO CONTRAST  Result Date: 04/13/2020 CLINICAL DATA:  53 year old female with altered mental status, delirium. EXAM: MRI HEAD WITHOUT CONTRAST TECHNIQUE: Multiplanar, multiecho pulse sequences of the brain and surrounding structures were obtained without intravenous contrast. COMPARISON:  Head CT earlier today. FINDINGS: Brain: Small foci of patchy restricted diffusion superimposed on encephalomalacia in the right anterior superior frontal gyrus, right ACA territory (series 5, image 40 and series 15, image 43). Underlying chronic cortical and  white matter gliosis and mild hemosiderin. No definite acute hemorrhage or mass effect. No other restricted diffusion identified. Mild T2 shine through in the pons. But extensive signal abnormality most suggestive of chronic ischemia in the bilateral deep white matter, bilateral deep gray matter nuclei and pons. Several superimposed small chronic infarcts in the cerebellum, more so the left. Associated Wallerian degeneration at the left midbrain. Mild if any associated chronic blood products in these areas. Mild ex vacuo ventricular enlargement. Additional moderate nonspecific scattered subcortical white matter T2 and FLAIR hyperintensity in both hemispheres. The corpus callosum is relatively spared, although there is some involvement of the splenium. No midline shift, mass effect, evidence of mass lesion, ventriculomegaly, extra-axial collection or acute intracranial hemorrhage. Cervicomedullary junction and pituitary are within normal limits. Vascular: Major intracranial vascular flow voids are preserved. Skull and upper cervical spine: Negative visible cervical spine. Visualized bone marrow signal  is within normal limits. Sinuses/Orbits: Negative. Other: Mastoids are clear. Visible internal auditory structures appear normal. Trace retained secretions in the nasopharynx. IMPRESSION: 1. 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 Underlying chronic blood products but no acute hemorrhage or mass effect. 2. Superimposed severe signal changes affecting the bilateral cerebral white matter, bilateral deep gray matter, and pons most suggestive of advanced small vessel ischemic disease. Relative sparing of the corpus callosum. 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 Several small chronic infarcts also in the cerebellum. Electronically Signed   By: Odessa Fleming M.D.   On: 04/13/2020 23:07   TTE: 1. Left ventricular ejection fraction, by estimation, is 60 to 65%. The  left ventricle has normal function. The left ventricle has no regional  wall motion abnormalities. Left ventricular diastolic parameters are  consistent with Grade II diastolic dysfunction (pseudonormalization). Elevated left atrial pressure.  2. Right ventricular systolic function is normal. The right ventricular  size is normal.  3. The mitral valve is normal in structure. Trivial mitral valve  regurgitation. No evidence of mitral stenosis.  4. The aortic valve is tricuspid. Aortic valve regurgitation is not  visualized. No aortic stenosis is present.  5. The inferior vena cava is normal in size with greater than 50%  respiratory variability, suggesting right atrial pressure of 3 mmHg.   Assessment: 54 y.o. female with subacute right ACA territory ischemic infarction 1. Neurological exam is nonfocal.  2. MRA head: Occluded Right ACA in the proximal A2 segment. Positive also for evidence of widespread severe intracranial atherosclerosis with hemodynamically significant stenoses: - Severe stenosis distal Left ICA. - patent Left ACA moderately to severely attenuated throughout. - Moderate multifocal stenoses Right  PCA.  3. CTA neck: The common carotid, internal carotid and vertebral arteries are patent within the neck without hemodynamically significant stenosis and without significant atherosclerotic disease.  4. MRI brain: Small acute on chronic Right ACA territory infarct. Underlying chronic blood products but no acute hemorrhage or mass effect. Superimposed severe signal changes affecting the bilateral cerebral white matter, bilateral deep gray matter, and pons most suggestive of advanced small vessel ischemic disease. Relative sparing of the corpus callosum. 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 Several small chronic infarcts also in the cerebellum. 5. TTE report does not describe a mural thrombus or cardiac vegetation.  6. EKG: Normal sinus rhythm. Nonspecific ST and T wave abnormality 7. Stroke Risk Factors - Morbid obesity  Recommendations: 1. Agree with starting ASA and atorvastatin. Add Plavix as DAPT is indicated in the setting of severe intracranial atherosclerosis.  2. Cardiac telemetry 3.  BP management 4. PT consult, OT consult, Speech consult 5. Risk factor modification 6. Frequent neuro checks 7. HgbA1c, fasting lipid panel 8. Radiology has recommended Neuro endovascular consultation regarding the distal Left ICA stenosis seen on MRA. However, would obtain CTA of head and neck first to more accurately assess the vasculature, as MRA can overestimate severe stenosis and and underestimate mild stenosis. CTA of head and neck has been ordered.     signed: Dr. Caryl Pina  04/14/2020, 11:42 AM

## 2020-04-14 NOTE — Progress Notes (Signed)
PT Cancellation Note  Patient Details Name: Rebekah Davenport MRN: [id number] DOB: [birth year]   Cancelled Treatment:    Reason Eval/Treat Not Completed: Other (comment).  PT consult received.  Chart reviewed.  Transport present to take pt off floor for testing (pt not available for PT evaluation).  Will re-attempt PT session at a later date/time as able.  Hendricks Limes, PT 04/14/20, 11:22 AM

## 2020-04-14 NOTE — Consult Note (Signed)
[facility] Vascular Consult Note  MRN : [id number]  Rebekah Davenport is a 54 y.o. (Jul 27, 1965) female who presents with chief complaint of  Chief Complaint  Patient presents with  . Altered Mental Status   History of Present Illness:  The patient is a 54 year old female with no significant past medical history who [facility] department with altered mental status.  Patient sought medical attention in emergency department for mental status changes.  The patient's sister who provided most of the history for this consult notes progressively worsening memory issues of last few weeks.  Patient feels that the sister is no longer functioning at baseline.  Sister also notes and on steady gait.   MRI Head (04/13/20): 1. 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 Underlying chronic blood products but no acute hemorrhage or mass effect.  MR Angio (04/14/20): 1. 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 2. Positive also for evidence of widespread severe intracranial atherosclerosis with hemodynamically significant stenoses: - Severe stenosis distal Left ICA. - patent Left ACA moderately to severely attenuated throughout. - Moderate multifocal stenoses Right PCA. 3. Recommend Neuro endovascular consultation regarding the distal Left ICA stenosis.  CT angio (04/14/20): 1) The common carotid, internal carotid and vertebral arteries are patent within the neck without hemodynamically significant stenosis and without significant atherosclerotic disease.  Vascular surgery was consulted by Dr. Adela Glimpseoutova for possible intervention.   Current Facility-Administered Medications  Medication Dose Route Frequency Provider Last Rate Last Admin  . acetaminophen (TYLENOL) tablet 650 mg  650 mg Oral Q6H PRN Therisa Doyneoutova, Anastassia, MD       Or  . acetaminophen (TYLENOL) suppository 650 mg  650 mg Rectal Q6H PRN Doutova,  Anastassia, MD      . albuterol (PROVENTIL) (2.5 MG/3ML) 0.083% nebulizer solution 2.5 mg  2.5 mg Nebulization Q2H PRN Doutova, Anastassia, MD      . ondansetron (ZOFRAN) tablet 4 mg  4 mg Oral Q6H PRN Doutova, Anastassia, MD       Or  . ondansetron (ZOFRAN) injection 4 mg  4 mg Intravenous Q6H PRN Therisa Doyneoutova, Anastassia, MD       Past Medical History:  Diagnosis Date  . Asthma   . Heart murmur    Past Surgical History:  Procedure Laterality Date  . CYST EXCISION     upper back  . PARTIAL HYSTERECTOMY     Social History Social History   Tobacco Use  . Smoking status: Current Every Day Smoker    Packs/day: 0.50    Years: 35.00    Pack years: 17.50    Types: Cigarettes  . Smokeless tobacco: Never Used  Vaping Use  . Vaping Use: Never used  Substance Use Topics  . Alcohol use: No  . Drug use: No   Family History Family History  Problem Relation Age of Onset  . Cancer - Cervical Mother   . Hyperlipidemia Father   . Hypertension Father   Denies family history of peripheral artery disease, renal disease or venous disease.  Positive family history for CVA.  No Known Allergies Percolate REVIEW OF SYSTEMS (Negative unless checked)  Constitutional: [] Weight loss  [] Fever  [] Chills Cardiac: [] Chest pain   [] Chest pressure   [] Palpitations   [] Shortness of breath when laying flat   [] Shortness of breath at rest   [] Shortness of breath with exertion. Vascular:  [] Pain in legs with walking   [] Pain in legs at rest   []   Pain in legs when laying flat   [] Claudication   [] Pain in feet when walking  [] Pain in feet at rest  [] Pain in feet when laying flat   [] History of DVT   [] Phlebitis   [] Swelling in legs   [] Varicose veins   [] Non-healing ulcers Pulmonary:   [] Uses home oxygen   [] Productive cough   [] Hemoptysis   [] Wheeze  [] COPD   [] Asthma Neurologic:  [] Dizziness  [] Blackouts   [] Seizures   [] History of stroke   [x] History of TIA  [] Aphasia   [] Temporary blindness   [] Dysphagia    [x] Weakness or numbness in arms   [x] Weakness or numbness in legs Musculoskeletal:  [] Arthritis   [] Joint swelling   [] Joint pain   [] Low back pain Hematologic:  [] Easy bruising  [] Easy bleeding   [] Hypercoagulable state   [] Anemic  [] Hepatitis Gastrointestinal:  [] Blood in stool   [] Vomiting blood  [] Gastroesophageal reflux/heartburn   [] Difficulty swallowing. Genitourinary:  [] Chronic kidney disease   [] Difficult urination  [] Frequent urination  [] Burning with urination   [] Blood in urine Skin:  [] Rashes   [] Ulcers   [] Wounds Psychological:  [] History of anxiety   []  History of major depression.  Physical Examination  Vitals:   04/14/20 0415 04/14/20 0500 04/14/20 0714 04/14/20 1229  BP:  139/67  133/76  Pulse:   60 (!) 57  Resp: 18 16 17 18   Temp:  98 F (36.7 C)  98.1 F (36.7 C)  TempSrc:    Oral  SpO2:  97% 99% 100%  Weight:      Height:       Body mass index is 45.75 kg/m. Gen:  WD/WN, NAD Head: [place]/AT, No temporalis wasting. Prominent temp pulse not noted. Ear/Nose/Throat: Hearing grossly intact, nares w/o erythema or drainage, oropharynx w/o Erythema/Exudate Eyes: Sclera non-icteric, conjunctiva clear Neck: Trachea midline.  No JVD.  Pulmonary:  Good air movement, respirations not labored, equal bilaterally.  Cardiac: RRR, normal S1, S2. Vascular:  Vessel Right Left  Radial Palpable Palpable  Ulnar Palpable Palpable  Brachial Palpable Palpable  Carotid Palpable, without bruit Palpable, without bruit  Aorta Not palpable N/A  Femoral Palpable Palpable  Popliteal Palpable Palpable  PT Palpable Palpable  DP Palpable Palpable   Gastrointestinal: soft, non-tender/non-distended. No guarding/reflex.  Musculoskeletal: M/S 5/5 throughout.  Extremities without ischemic changes.  No deformity or atrophy. No edema. Neurologic: Sensation grossly intact in extremities.  Symmetrical.  Speech is fluent. Motor exam as listed above. Psychiatric: Judgment intact, Mood & affect  appropriate for pt's clinical situation. Dermatologic: No rashes or ulcers noted.  No cellulitis or open wounds. Lymph : No Cervical, Axillary, or Inguinal lymphadenopathy.  CBC Lab Results  Component Value Date   WBC 8.2 04/14/2020   HGB 14.9 04/14/2020   HCT 43.3 04/14/2020   MCV 88.0 04/14/2020   PLT 254 04/14/2020   BMET    Component Value Date/Time   NA 142 04/14/2020 0533   K 3.7 04/14/2020 0533   CL 110 04/14/2020 0533   CO2 22 04/14/2020 0533   GLUCOSE 99 04/14/2020 0533   BUN 11 04/14/2020 0533   CREATININE 0.71 04/14/2020 0533   CALCIUM 8.8 (L) 04/14/2020 0533   GFRNONAA >60 04/14/2020 0533   GFRAA >60 03/14/2016 0836   Estimated Creatinine Clearance: 104.2 mL/min (by C-G formula based on SCr of 0.71 mg/dL).  COAG No results found for: INR, PROTIME  Radiology DG Chest 2 View  Result Date: 04/13/2020 CLINICAL DATA:  Altered mental status EXAM: CHEST -  2 VIEW COMPARISON:  03/14/2016 FINDINGS: Cardiac shadow is within normal limits. The lungs are well aerated bilaterally. No focal infiltrate or sizable effusion is seen. Calcified granuloma is noted in the right base. No bony abnormality is noted. IMPRESSION: No acute abnormality noted. Electronically Signed   By: Alcide Clever M.D.   On: 04/13/2020 23:19   CT Head Wo Contrast  Result Date: 04/13/2020 CLINICAL DATA:  Delirium.  Altered mental status EXAM: CT HEAD WITHOUT CONTRAST TECHNIQUE: Contiguous axial images were obtained from the base of the skull through the vertex without intravenous contrast. COMPARISON:  None. FINDINGS: Brain: Hypodensity throughout the frontal white matter bilaterally extending into the internal capsule. This is symmetric and appears chronic. Milder hypodensity in the parietal white matter bilaterally also appears chronic. Hypodensity right frontal lobe consistent with chronic cortical infarct. Ventricle size normal.  Negative for acute infarct, hemorrhage, mass Vascular: Negative for  hyperdense vessel Skull: Negative Sinuses/Orbits: Paranasal sinuses clear.  Negative orbit Other: None IMPRESSION: Diffuse white matter changes. While nonspecific, this is likely due to chronic microvascular ischemic change. Correlate with risk factors. Chronic appearing infarct right frontal lobe No acute abnormality. Electronically Signed   By: Marlan Palau M.D.   On: 04/13/2020 17:04   CT ANGIO NECK W OR WO CONTRAST  Result Date: 04/14/2020 CLINICAL DATA:  Transient ischemic attack (TIA). EXAM: CT ANGIOGRAPHY NECK TECHNIQUE: Multidetector CT imaging of the neck was performed using the standard protocol during bolus administration of intravenous contrast. Multiplanar CT image reconstructions and MIPs were obtained to evaluate the vascular anatomy. Carotid stenosis measurements (when applicable) are obtained utilizing NASCET criteria, using the distal internal carotid diameter as the denominator. CONTRAST:  43mL OMNIPAQUE IOHEXOL 350 MG/ML SOLN COMPARISON:  No pertinent prior exams are available for comparison. FINDINGS: Aortic arch: Standard aortic branching. Streak artifact from a dense left-sided contrast bolus partially obscures the left subclavian artery. Within this limitation, no evidence of hemodynamically significant innominate or proximal subclavian artery stenosis. Right carotid system: CCA and ICA patent within the neck without stenosis. No significant atherosclerotic disease. Left carotid system: CCA and ICA patent within the neck without stenosis. No significant atherosclerotic disease. Vertebral arteries: Codominant and patent within the neck without significant stenosis Skeleton: No acute bony abnormality or aggressive osseous lesion. Advanced C5-C6 spondylosis with severe disc space narrowing, a shallow posterior disc osteophyte complex and uncovertebral hypertrophy. No appreciable high-grade bony spinal canal or neural foraminal narrowing. Other neck: No neck mass or cervical  lymphadenopathy. Upper chest: No consolidation within the imaged lung apices. 5 mm right upper lobe pulmonary nodule (series 6, image 66). IMPRESSION: The common carotid, internal carotid and vertebral arteries are patent within the neck without hemodynamically significant stenosis and without significant atherosclerotic disease. Please refer to the recent prior MRA of the head for a description of intracranial atherosclerotic disease and multifocal intracranial arterial stenoses. 5 mm right upper lobe pulmonary nodule. No follow-up needed if patient is low-risk. Non-contrast chest CT can be considered in 12 months if patient is high-risk. This recommendation follows the consensus statement: Guidelines for Management of Incidental Pulmonary Nodules Detected on CT Images: From the Fleischner Society 2017; Radiology 2017; 284:228-243. Electronically Signed   By: Jackey Loge DO   On: 04/14/2020 12:20   MR ANGIO HEAD WO CONTRAST  Addendum Date: 04/14/2020   ADDENDUM REPORT: 04/14/2020 02:17 ADDENDUM: Study discussed by telephone with Dr. Para March on 04/14/2020 at 02:17 . Electronically Signed   By: Odessa Fleming M.D.   On:  04/14/2020 02:17   Result Date: 04/14/2020 CLINICAL DATA:  54 year old female with altered mental status and acute on chronic ischemia on brain MRI tonight. EXAM: MRA HEAD WITHOUT CONTRAST TECHNIQUE: Angiographic images of the Circle of Willis were obtained using MRA technique without intravenous contrast. COMPARISON:  Brain MRI earlier tonight.  Head CT FINDINGS: Antegrade flow in the posterior circulation. Fairly codominant distal vertebral arteries are patent to the basilar, with mildly fenestrated vertebrobasilar junction (normal variant). There is distal vertebral irregularity suggesting atherosclerosis, but no significant distal vertebral stenosis. Both PICA origins remain patent. Patent basilar artery with mild irregularity, no stenosis. Patent SCA and PCA origins. Right posterior  communicating artery is present, the left is diminutive or absent. Left PCA branches are within normal limits. There is multifocal moderate irregularity and stenosis of the right PCA (series 1043, image 14) which remains patent. Antegrade flow in both ICA siphons. Probable artifactual loss of flow signal in the a petrous left ICA from asymmetric left petrous apex air cell susceptibility. The right ICA siphon is patent with mild irregularity. The left siphon is patent but with severe supraclinoid stenosis including a small flow gap of the distal ICA (series 1053, image 17). Carotid termini remain patent. MCA and ACA origins are patent. The left A1 appears dominant. Anterior communicating artery is within normal limits. The right ACA A2 segment is occluded just distal to the A-comm (series 1053, image 12). And the left A2/A3 is patent but with moderate to severe attenuation of the vessel throughout. Left MCA M1 segment is mildly irregular without stenosis. Left MCA bifurcation and visible left MCA branches are within normal limits. Right MCA M1 is patent with mild irregularity. There is moderate stenosis at the right anterior temporal artery origin. Right MCA bifurcation is patent without stenosis. Possible moderate irregularity and stenosis of visible right MCA M3 branches. IMPRESSION: 1. 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 2. Positive also for evidence of widespread severe intracranial atherosclerosis with hemodynamically significant stenoses: - Severe stenosis distal Left ICA. - patent Left ACA moderately to severely attenuated throughout. - Moderate multifocal stenoses Right PCA. 3. Recommend Neuro endovascular consultation regarding the distal Left ICA stenosis (#2). Electronically Signed: By: Odessa Fleming M.D. On: 04/14/2020 02:04   MR BRAIN WO CONTRAST  Result Date: 04/13/2020 CLINICAL DATA:  54 year old female with altered mental status, delirium. EXAM: MRI HEAD WITHOUT CONTRAST TECHNIQUE:  Multiplanar, multiecho pulse sequences of the brain and surrounding structures were obtained without intravenous contrast. COMPARISON:  Head CT earlier today. FINDINGS: Brain: Small foci of patchy restricted diffusion superimposed on encephalomalacia in the right anterior superior frontal gyrus, right ACA territory (series 5, image 40 and series 15, image 43). Underlying chronic cortical and white matter gliosis and mild hemosiderin. No definite acute hemorrhage or mass effect. No other restricted diffusion identified. Mild T2 shine through in the pons. But extensive signal abnormality most suggestive of chronic ischemia in the bilateral deep white matter, bilateral deep gray matter nuclei and pons. Several superimposed small chronic infarcts in the cerebellum, more so the left. Associated Wallerian degeneration at the left midbrain. Mild if any associated chronic blood products in these areas. Mild ex vacuo ventricular enlargement. Additional moderate nonspecific scattered subcortical white matter T2 and FLAIR hyperintensity in both hemispheres. The corpus callosum is relatively spared, although there is some involvement of the splenium. No midline shift, mass effect, evidence of mass lesion, ventriculomegaly, extra-axial collection or acute intracranial hemorrhage. Cervicomedullary junction and pituitary are within normal limits.  Vascular: Major intracranial vascular flow voids are preserved. Skull and upper cervical spine: Negative visible cervical spine. Visualized bone marrow signal is within normal limits. Sinuses/Orbits: Negative. Other: Mastoids are clear. Visible internal auditory structures appear normal. Trace retained secretions in the nasopharynx. IMPRESSION: 1. 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 Underlying chronic blood products but no acute hemorrhage or mass effect. 2. Superimposed severe signal changes affecting the bilateral cerebral white matter, bilateral deep gray matter, and  pons most suggestive of advanced small vessel ischemic disease. Relative sparing of the corpus callosum. 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 Several small chronic infarcts also in the cerebellum. Electronically Signed   By: Odessa Fleming M.D.   On: 04/13/2020 23:07   ECHOCARDIOGRAM COMPLETE  Result Date: 04/14/2020    ECHOCARDIOGRAM REPORT   Patient Name:   Rebekah Davenport Date of Exam: 04/14/2020 Medical Rec #:  [id number]        Height:       65.0 in Accession #:    [id number]       Weight:       274.9 lb Date of Birth:  [birth year]        BSA:          2.265 m Patient Age:    54 years         BP:           139/67 mmHg Patient Gender: F                HR:           57 bpm. Exam Location:  [facility] Procedure: 2D Echo, Color Doppler and Cardiac Doppler Indications:    R01.1 Murmur  History:        Patient has no prior history of Echocardiogram examinations.                 COPD.  Sonographer:    Humphrey Rolls RDCS (AE) Referring Phys: Consepcion Hearing ANASTASSIA DOUTOVA Diagnosing      Yvonne Kendall MD Phys:  Sonographer Comments: Suboptimal subcostal window. Image acquisition challenging due to uncooperative patient. IMPRESSIONS  1. Left ventricular ejection fraction, by estimation, is 60 to 65%. The left ventricle has normal function. The left ventricle has no regional wall motion abnormalities. Left ventricular diastolic parameters are consistent with Grade II diastolic dysfunction (pseudonormalization). Elevated left atrial pressure.  2. Right ventricular systolic function is normal. The right ventricular size is normal.  3. The mitral valve is normal in structure. Trivial mitral valve regurgitation. No evidence of mitral stenosis.  4. The aortic valve is tricuspid. Aortic valve regurgitation is not visualized. No aortic stenosis is present.  5. The inferior vena cava is normal in size with greater than 50% respiratory variability, suggesting right atrial pressure of 3 mmHg. FINDINGS  Left Ventricle: Left  ventricular ejection fraction, by estimation, is 60 to 65%. The left ventricle has normal function. The left ventricle has no regional wall motion abnormalities. The left ventricular internal cavity size was normal in size. There is  borderline left ventricular hypertrophy. Left ventricular diastolic parameters are consistent with Grade II diastolic dysfunction (pseudonormalization). Elevated left atrial pressure. Right Ventricle: The right ventricular size is normal. No increase in right ventricular wall thickness. Right ventricular systolic function is normal. Left Atrium: Left atrial size was normal in size. Right Atrium: Right atrial size was normal in size. Pericardium: Trivial pericardial effusion is present. Mitral Valve: The mitral valve is normal in structure. Trivial  mitral valve regurgitation. No evidence of mitral valve stenosis. MV peak gradient, 4.5 mmHg. The mean mitral valve gradient is 2.0 mmHg. Tricuspid Valve: The tricuspid valve is not well visualized. Tricuspid valve regurgitation is not demonstrated. Aortic Valve: The aortic valve is tricuspid. Aortic valve regurgitation is not visualized. No aortic stenosis is present. Aortic valve mean gradient measures 5.0 mmHg. Aortic valve peak gradient measures 11.0 mmHg. Aortic valve area, by VTI measures 2.32  cm. Pulmonic Valve: The pulmonic valve was normal in structure. Pulmonic valve regurgitation is trivial. No evidence of pulmonic stenosis. Aorta: The aortic root is normal in size and structure. Pulmonary Artery: The pulmonary artery is of normal size. Venous: The inferior vena cava is normal in size with greater than 50% respiratory variability, suggesting right atrial pressure of 3 mmHg. IAS/Shunts: The interatrial septum was not well visualized.  LEFT VENTRICLE PLAX 2D LVIDd:         4.51 cm  Diastology LVIDs:         2.72 cm  LV e' medial:    5.33 cm/s LV PW:         1.04 cm  LV E/e' medial:  18.9 LV IVS:        0.92 cm  LV e' lateral:   7.83  cm/s LVOT diam:     1.90 cm  LV E/e' lateral: 12.9 LV SV:         84 LV SV Index:   37 LVOT Area:     2.84 cm  RIGHT VENTRICLE RV Basal diam:  3.48 cm LEFT ATRIUM             Index       RIGHT ATRIUM           Index LA diam:        3.20 cm 1.41 cm/m  RA Area:     12.40 cm LA Vol (A2C):   41.5 ml 18.33 ml/m RA Volume:   25.80 ml  11.39 ml/m LA Vol (A4C):   67.1 ml 29.63 ml/m LA Biplane Vol: 57.0 ml 25.17 ml/m  AORTIC VALVE                   PULMONIC VALVE AV Area (Vmax):    2.27 cm    PV Vmax:       1.29 m/s AV Area (Vmean):   2.36 cm    PV Vmean:      76.100 cm/s AV Area (VTI):     2.32 cm    PV VTI:        0.264 m AV Vmax:           166.00 cm/s PV Peak grad:  6.7 mmHg AV Vmean:          99.800 cm/s PV Mean grad:  3.0 mmHg AV VTI:            0.361 m AV Peak Grad:      11.0 mmHg AV Mean Grad:      5.0 mmHg LVOT Vmax:         133.00 cm/s LVOT Vmean:        83.200 cm/s LVOT VTI:          0.295 m LVOT/AV VTI ratio: 0.82  AORTA Ao Root diam: 3.40 cm MITRAL VALVE MV Area (PHT): 2.56 cm     SHUNTS MV Peak grad:  4.5 mmHg     Systemic VTI:  0.30 m MV Mean grad:  2.0 mmHg  Systemic Diam: 1.90 cm MV Vmax:       1.06 m/s MV Vmean:      55.7 cm/s MV Decel Time: 296 msec MV E velocity: 101.00 cm/s MV A velocity: 79.50 cm/s MV E/A ratio:  1.27 Yvonne Kendall MD Electronically signed by Yvonne Kendall MD Signature Date/Time: 04/14/2020/1:01:22 PM    Final    Assessment/Plan The patient is a 54 year old female with no significant past medical history who presented to the [facility] emergency department with altered mental status.  1.  Altered mental status: Patient found to have acute on chronic CVA.  Reviewed imaging with Dr. Gilda Crease.  Patient with extensive intracranial atherosclerotic disease.  No large hemodynamically significant cervical vessel disease.  Unfortunately, we do not treat intracranial atherosclerotic disease.  Agree with neurology consultation.  Possible neuro  interventional as an outpatient.  2.  Atherosclerotic Disease: Recommend medical management with statin, ACE, aspirin and Plavix.  3.  Active Tobacco Abuse: We had a discussion for approximately five minutes regarding the absolute need for smoking cessation due to the deleterious nature of tobacco on the vascular system. We discussed the tobacco use would diminish patency of any intervention, and likely significantly worsen progression of disease. We discussed multiple agents for quitting including replacement therapy or medications to reduce cravings such as Chantix. The patient voices their understanding of the importance of smoking cessation.  Discussed with Dr. Weldon Inches, PA-C  04/14/2020 2:45 PM  This note was created with Dragon medical transcription system.  Any error is purely unintentional

## 2020-04-14 NOTE — TOC Progression Note (Signed)
Transition of Care [facility]) - Progression Note    Patient Details  Name: Rebekah Davenport MRN: [id number] Date of Birth: 10-Feb-1966  Transition of Care [facility]) CM/SW Contact  Eilleen Kempf, LCSW Phone Number: 04/14/2020, 3:34 PM  Clinical Narrative:    Patient is not medically stable for discharge, estimated d/c maybe Wed. Not TOC needs identified yet, will continue to follow.         Expected Discharge Plan and Services                                                 Social Determinants of Health (SDOH) Interventions    Readmission Risk Interventions No flowsheet data found.

## 2020-04-14 NOTE — Progress Notes (Signed)
PROGRESS NOTE  Rebekah Davenport YTK:[id number] DOB: May 29, 1966 DOA: 04/13/2020 PCP: Patient, No Pcp Per  HPI/Recap of past 24 hours: Rebekah Davenport is a 54 y.o. female with medical history significant of COPD, heart murmur.  Presented with confusion, started to urinate on herself which is not usual for her.  Was brought to emergency department by her sister.  Has had subacute changes forgetting things.  IN the ED oriented to self and place.  MRI showed subacute R ACA ischemic CVA.  Extensive chronic microvascular ischemic changes were also noted.   Assessment/Plan: Active Problems:   Acute encephalopathy  Subacute R ACA ischemic CVA Presented with confusion and gait abnormality for > 1 week per family MRI showed subacute R ACA ischemic CVA.  Extensive chronic microvascular ischemic changes were also noted. Admitted for stroke workup 2D echo normal LVEF 60-65% Grade IIDD LDL 112 goal <70 A1C pending MR Angio 1. 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 2. Positive also for evidence of widespread severe intracranial atherosclerosis with hemodynamically significant stenoses: - Severe stenosis distal Left ICA. - patent Left ACA moderately to severely attenuated throughout. - Moderate multifocal stenoses Right PCA. Vascular surgery consulted and following Start ASA 325 mg daily and Lipitor 80 mg daily PT OT Speech eval pending Neuro checks q4H BP goal normotensive Continue to monitor with telemetry  Acute metabolic encephalopathy 2/2 to above Treat underlying condition Orient as needed  HLD LDL 112, goal <70 Lipitor 80 mg daily  Ambulatory dysfunction post stroke PT OT to assess Fall precautions   Code Status: Full   Family Communication: None at bedside   Disposition Plan: Pending PT OT assessment   Consultants:  Neurology  Vascular surgery  Procedures:  None  Antimicrobials:  None  DVT prophylaxis:  Sq lovenox daily  Status is:  Inpatient    Dispo:  Patient From: Home  Planned Disposition: Home with Health Care Svc  Expected discharge date: 04/15/20  Medically stable for discharge: No, ongoing management of newly diagnosed CVA.         Objective: Vitals:   04/14/20 0415 04/14/20 0500 04/14/20 0714 04/14/20 1229  BP:  139/67  133/76  Pulse:   60 (!) 57  Resp: 18 16 17 18   Temp:  98 F (36.7 C)  98.1 F (36.7 C)  TempSrc:    Oral  SpO2:  97% 99% 100%  Weight:      Height:        Intake/Output Summary (Last 24 hours) at 04/14/2020 1530 Last data filed at 04/14/2020 1300 Gross per 24 hour  Intake 480 ml  Output 2 ml  Net 478 ml   Filed Weights   04/13/20 1633  Weight: 124.7 kg    Exam:  . General: 54 y.o. year-old female well developed well nourished in no acute distress.  Alert and oriented x3. . Cardiovascular: Regular rate and rhythm with no rubs or gallops.  No thyromegaly or JVD noted.   59 Respiratory: Clear to auscultation with no wheezes or rales. Good inspiratory effort. . Abdomen: Soft nontender nondistended with normal bowel sounds x4 quadrants. . Musculoskeletal: No lower extremity edema. 2/4 pulses in all 4 extremities. Marland Kitchen Psychiatry: Mood is appropriate for condition and setting   Data Reviewed: CBC: Recent Labs  Lab 04/13/20 1635 04/13/20 1642 04/14/20 0533  WBC 8.5 8.6 8.2  NEUTROABS  --  4.8 4.0  HGB 15.2* 15.4* 14.9  HCT 44.5 45.5 43.3  MCV 88.1 88.7 88.0  PLT  272 281 254   Basic Metabolic Panel: Recent Labs  Lab 04/13/20 1635 04/13/20 1642 04/14/20 0533  NA 139  --  142  K 3.6  --  3.7  CL 106  --  110  CO2 26  --  22  GLUCOSE 106*  --  99  BUN 12  --  11  CREATININE 0.98  --  0.71  CALCIUM 8.9  --  8.8*  MG  --  2.2 2.2  PHOS  --   --  3.5   GFR: Estimated Creatinine Clearance: 104.2 mL/min (by C-G formula based on SCr of 0.71 mg/dL). Liver Function Tests: Recent Labs  Lab 04/13/20 1635 04/14/20 0533  AST 20 20  ALT 14 14  ALKPHOS 58  51  BILITOT 0.6 0.6  PROT 7.4 6.7  ALBUMIN 3.9 3.5   No results for input(s): LIPASE, AMYLASE in the last 168 hours. Recent Labs  Lab 04/14/20 0046  AMMONIA 34   Coagulation Profile: No results for input(s): INR, PROTIME in the last 168 hours. Cardiac Enzymes: Recent Labs  Lab 04/13/20 1642  CKTOTAL 160   BNP (last 3 results) No results for input(s): PROBNP in the last 8760 hours. HbA1C: No results for input(s): HGBA1C in the last 72 hours. CBG: No results for input(s): GLUCAP in the last 168 hours. Lipid Profile: Recent Labs    04/14/20 0533  CHOL 170  HDL 46  LDLCALC 112*  TRIG 60  CHOLHDL 3.7   Thyroid Function Tests: Recent Labs    04/13/20 1642  TSH 2.740   Anemia Panel: Recent Labs    04/14/20 0533  FERRITIN 155   Urine analysis:    Component Value Date/Time   COLORURINE YELLOW (A) 04/13/2020 1642   APPEARANCEUR HAZY (A) 04/13/2020 1642   LABSPEC 1.026 04/13/2020 1642   PHURINE 5.0 04/13/2020 1642   GLUCOSEU NEGATIVE 04/13/2020 1642   HGBUR SMALL (A) 04/13/2020 1642   BILIRUBINUR NEGATIVE 04/13/2020 1642   KETONESUR NEGATIVE 04/13/2020 1642   PROTEINUR NEGATIVE 04/13/2020 1642   NITRITE NEGATIVE 04/13/2020 1642   LEUKOCYTESUR NEGATIVE 04/13/2020 1642   Sepsis Labs: @LABRCNTIP (procalcitonin:4,lacticidven:4)  ) Recent Results (from the past 240 hour(s))  Respiratory Panel by RT PCR (Flu A&B, Covid) - Nasopharyngeal Swab     Status: None   Collection Time: 04/13/20 11:11 PM   Specimen: Nasopharyngeal Swab  Result Value Ref Range Status   SARS Coronavirus 2 by RT PCR NEGATIVE NEGATIVE Final    Comment: (NOTE) SARS-CoV-2 target nucleic acids are NOT DETECTED.  The SARS-CoV-2 RNA is generally detectable in upper respiratoy specimens during the acute phase of infection. The lowest concentration of SARS-CoV-2 viral copies this assay can detect is 131 copies/mL. A negative result does not preclude SARS-Cov-2 infection and should not be used  as the sole basis for treatment or other patient management decisions. A negative result may occur with  improper specimen collection/handling, submission of specimen other than nasopharyngeal swab, presence of viral mutation(s) within the areas targeted by this assay, and inadequate number of viral copies (<131 copies/mL). A negative result must be combined with clinical observations, patient history, and epidemiological information. The expected result is Negative.  Fact Sheet for Patients:  04/15/20  Fact Sheet for Healthcare Providers:  https://www.moore.com/  This test is no t yet approved or cleared by the https://www.young.biz/ FDA and  has been authorized for detection and/or diagnosis of SARS-CoV-2 by FDA under an Emergency Use Authorization (EUA). This EUA will remain  in  effect (meaning this test can be used) for the duration of the COVID-19 declaration under Section 564(b)(1) of the Act, 21 U.S.C. section 360bbb-3(b)(1), unless the authorization is terminated or revoked sooner.     Influenza A by PCR NEGATIVE NEGATIVE Final   Influenza B by PCR NEGATIVE NEGATIVE Final    Comment: (NOTE) The Xpert Xpress SARS-CoV-2/FLU/RSV assay is intended as an aid in  the diagnosis of influenza from Nasopharyngeal swab specimens and  should not be used as a sole basis for treatment. Nasal washings and  aspirates are unacceptable for Xpert Xpress SARS-CoV-2/FLU/RSV  testing.  Fact Sheet for Patients: https://www.moore.com/  Fact Sheet for Healthcare Providers: https://www.young.biz/  This test is not yet approved or cleared by the [place] FDA and  has been authorized for detection and/or diagnosis of SARS-CoV-2 by  FDA under an Emergency Use Authorization (EUA). This EUA will remain  in effect (meaning this test can be used) for the duration of the  Covid-19 declaration under Section  564(b)(1) of the Act, 21  U.S.C. section 360bbb-3(b)(1), unless the authorization is  terminated or revoked. Performed at [facility], [address] [place], [place] [address]       Studies: DG Chest 2 View  Result Date: 04/13/2020 CLINICAL DATA:  Altered mental status EXAM: CHEST - 2 VIEW COMPARISON:  03/14/2016 FINDINGS: Cardiac shadow is within normal limits. The lungs are well aerated bilaterally. No focal infiltrate or sizable effusion is seen. Calcified granuloma is noted in the right base. No bony abnormality is noted. IMPRESSION: No acute abnormality noted. Electronically Signed   By: Alcide Clever M.D.   On: 04/13/2020 23:19   CT Head Wo Contrast  Result Date: 04/13/2020 CLINICAL DATA:  Delirium.  Altered mental status EXAM: CT HEAD WITHOUT CONTRAST TECHNIQUE: Contiguous axial images were obtained from the base of the skull through the vertex without intravenous contrast. COMPARISON:  None. FINDINGS: Brain: Hypodensity throughout the frontal white matter bilaterally extending into the internal capsule. This is symmetric and appears chronic. Milder hypodensity in the parietal white matter bilaterally also appears chronic. Hypodensity right frontal lobe consistent with chronic cortical infarct. Ventricle size normal.  Negative for acute infarct, hemorrhage, mass Vascular: Negative for hyperdense vessel Skull: Negative Sinuses/Orbits: Paranasal sinuses clear.  Negative orbit Other: None IMPRESSION: Diffuse white matter changes. While nonspecific, this is likely due to chronic microvascular ischemic change. Correlate with risk factors. Chronic appearing infarct right frontal lobe No acute abnormality. Electronically Signed   By: Marlan Palau M.D.   On: 04/13/2020 17:04   CT ANGIO NECK W OR WO CONTRAST  Result Date: 04/14/2020 CLINICAL DATA:  Transient ischemic attack (TIA). EXAM: CT ANGIOGRAPHY NECK TECHNIQUE: Multidetector CT imaging of the neck was performed using the  standard protocol during bolus administration of intravenous contrast. Multiplanar CT image reconstructions and MIPs were obtained to evaluate the vascular anatomy. Carotid stenosis measurements (when applicable) are obtained utilizing NASCET criteria, using the distal internal carotid diameter as the denominator. CONTRAST:  82mL OMNIPAQUE IOHEXOL 350 MG/ML SOLN COMPARISON:  No pertinent prior exams are available for comparison. FINDINGS: Aortic arch: Standard aortic branching. Streak artifact from a dense left-sided contrast bolus partially obscures the left subclavian artery. Within this limitation, no evidence of hemodynamically significant innominate or proximal subclavian artery stenosis. Right carotid system: CCA and ICA patent within the neck without stenosis. No significant atherosclerotic disease. Left carotid system: CCA and ICA patent within the neck without stenosis. No significant atherosclerotic disease. Vertebral arteries: Codominant  and patent within the neck without significant stenosis Skeleton: No acute bony abnormality or aggressive osseous lesion. Advanced C5-C6 spondylosis with severe disc space narrowing, a shallow posterior disc osteophyte complex and uncovertebral hypertrophy. No appreciable high-grade bony spinal canal or neural foraminal narrowing. Other neck: No neck mass or cervical lymphadenopathy. Upper chest: No consolidation within the imaged lung apices. 5 mm right upper lobe pulmonary nodule (series 6, image 66). IMPRESSION: The common carotid, internal carotid and vertebral arteries are patent within the neck without hemodynamically significant stenosis and without significant atherosclerotic disease. Please refer to the recent prior MRA of the head for a description of intracranial atherosclerotic disease and multifocal intracranial arterial stenoses. 5 mm right upper lobe pulmonary nodule. No follow-up needed if patient is low-risk. Non-contrast chest CT can be considered in 12  months if patient is high-risk. This recommendation follows the consensus statement: Guidelines for Management of Incidental Pulmonary Nodules Detected on CT Images: From the Fleischner Society 2017; Radiology 2017; 284:228-243. Electronically Signed   By: Jackey Loge DO   On: 04/14/2020 12:20   MR ANGIO HEAD WO CONTRAST  Addendum Date: 04/14/2020   ADDENDUM REPORT: 04/14/2020 02:17 ADDENDUM: Study discussed by telephone with Dr. Para March on 04/14/2020 at 02:17 . Electronically Signed   By: Odessa Fleming M.D.   On: 04/14/2020 02:17   Result Date: 04/14/2020 CLINICAL DATA:  54 year old female with altered mental status and acute on chronic ischemia on brain MRI tonight. EXAM: MRA HEAD WITHOUT CONTRAST TECHNIQUE: Angiographic images of the Circle of Willis were obtained using MRA technique without intravenous contrast. COMPARISON:  Brain MRI earlier tonight.  Head CT FINDINGS: Antegrade flow in the posterior circulation. Fairly codominant distal vertebral arteries are patent to the basilar, with mildly fenestrated vertebrobasilar junction (normal variant). There is distal vertebral irregularity suggesting atherosclerosis, but no significant distal vertebral stenosis. Both PICA origins remain patent. Patent basilar artery with mild irregularity, no stenosis. Patent SCA and PCA origins. Right posterior communicating artery is present, the left is diminutive or absent. Left PCA branches are within normal limits. There is multifocal moderate irregularity and stenosis of the right PCA (series 1043, image 14) which remains patent. Antegrade flow in both ICA siphons. Probable artifactual loss of flow signal in the a petrous left ICA from asymmetric left petrous apex air cell susceptibility. The right ICA siphon is patent with mild irregularity. The left siphon is patent but with severe supraclinoid stenosis including a small flow gap of the distal ICA (series 1053, image 17). Carotid termini remain patent. MCA and ACA  origins are patent. The left A1 appears dominant. Anterior communicating artery is within normal limits. The right ACA A2 segment is occluded just distal to the A-comm (series 1053, image 12). And the left A2/A3 is patent but with moderate to severe attenuation of the vessel throughout. Left MCA M1 segment is mildly irregular without stenosis. Left MCA bifurcation and visible left MCA branches are within normal limits. Right MCA M1 is patent with mild irregularity. There is moderate stenosis at the right anterior temporal artery origin. Right MCA bifurcation is patent without stenosis. Possible moderate irregularity and stenosis of visible right MCA M3 branches. IMPRESSION: 1. 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 2. Positive also for evidence of widespread severe intracranial atherosclerosis with hemodynamically significant stenoses: - Severe stenosis distal Left ICA. - patent Left ACA moderately to severely attenuated throughout. - Moderate multifocal stenoses Right PCA. 3. Recommend Neuro endovascular consultation regarding the distal Left ICA stenosis (#  2). Electronically Signed: By: Odessa FlemingH  Lanika Colgate M.D. On: 04/14/2020 02:04   MR BRAIN WO CONTRAST  Result Date: 04/13/2020 CLINICAL DATA:  54 year old female with altered mental status, delirium. EXAM: MRI HEAD WITHOUT CONTRAST TECHNIQUE: Multiplanar, multiecho pulse sequences of the brain and surrounding structures were obtained without intravenous contrast. COMPARISON:  Head CT earlier today. FINDINGS: Brain: Small foci of patchy restricted diffusion superimposed on encephalomalacia in the right anterior superior frontal gyrus, right ACA territory (series 5, image 40 and series 15, image 43). Underlying chronic cortical and white matter gliosis and mild hemosiderin. No definite acute hemorrhage or mass effect. No other restricted diffusion identified. Mild T2 shine through in the pons. But extensive signal abnormality most suggestive of chronic ischemia in  the bilateral deep white matter, bilateral deep gray matter nuclei and pons. Several superimposed small chronic infarcts in the cerebellum, more so the left. Associated Wallerian degeneration at the left midbrain. Mild if any associated chronic blood products in these areas. Mild ex vacuo ventricular enlargement. Additional moderate nonspecific scattered subcortical white matter T2 and FLAIR hyperintensity in both hemispheres. The corpus callosum is relatively spared, although there is some involvement of the splenium. No midline shift, mass effect, evidence of mass lesion, ventriculomegaly, extra-axial collection or acute intracranial hemorrhage. Cervicomedullary junction and pituitary are within normal limits. Vascular: Major intracranial vascular flow voids are preserved. Skull and upper cervical spine: Negative visible cervical spine. Visualized bone marrow signal is within normal limits. Sinuses/Orbits: Negative. Other: Mastoids are clear. Visible internal auditory structures appear normal. Trace retained secretions in the nasopharynx. IMPRESSION: 1. 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 Underlying chronic blood products but no acute hemorrhage or mass effect. 2. Superimposed severe signal changes affecting the bilateral cerebral white matter, bilateral deep gray matter, and pons most suggestive of advanced small vessel ischemic disease. Relative sparing of the corpus callosum. 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 Several small chronic infarcts also in the cerebellum. Electronically Signed   By: Odessa FlemingH  Raechel Marcos M.D.   On: 04/13/2020 23:07   ECHOCARDIOGRAM COMPLETE  Result Date: 04/14/2020    ECHOCARDIOGRAM REPORT   Patient Name:   Rebekah Davenport Date of Exam: 04/14/2020 Medical Rec #:  [id number]        Height:       65.0 in Accession #:    [id number]       Weight:       274.9 lb Date of Birth:  1966-05-18        BSA:          2.265 m Patient Age:    54 years         BP:            139/67 mmHg Patient Gender: F                HR:           57 bpm. Exam Location:  [facility] Procedure: 2D Echo, Color Doppler and Cardiac Doppler Indications:    R01.1 Murmur  History:        Patient has no prior history of Echocardiogram examinations.                 COPD.  Sonographer:    Humphrey RollsJoan Heiss RDCS (AE) Referring Phys: Consepcion [id number] ANASTASSIA DOUTOVA Diagnosing      Yvonne Kendallhristopher End MD Phys:  Sonographer Comments: Suboptimal subcostal window. Image acquisition challenging due to uncooperative patient. IMPRESSIONS  1. Left ventricular ejection fraction, by estimation,  is 60 to 65%. The left ventricle has normal function. The left ventricle has no regional wall motion abnormalities. Left ventricular diastolic parameters are consistent with Grade II diastolic dysfunction (pseudonormalization). Elevated left atrial pressure.  2. Right ventricular systolic function is normal. The right ventricular size is normal.  3. The mitral valve is normal in structure. Trivial mitral valve regurgitation. No evidence of mitral stenosis.  4. The aortic valve is tricuspid. Aortic valve regurgitation is not visualized. No aortic stenosis is present.  5. The inferior vena cava is normal in size with greater than 50% respiratory variability, suggesting right atrial pressure of 3 mmHg. FINDINGS  Left Ventricle: Left ventricular ejection fraction, by estimation, is 60 to 65%. The left ventricle has normal function. The left ventricle has no regional wall motion abnormalities. The left ventricular internal cavity size was normal in size. There is  borderline left ventricular hypertrophy. Left ventricular diastolic parameters are consistent with Grade II diastolic dysfunction (pseudonormalization). Elevated left atrial pressure. Right Ventricle: The right ventricular size is normal. No increase in right ventricular wall thickness. Right ventricular systolic function is normal. Left Atrium: Left atrial size was normal in size. Right Atrium: Right  atrial size was normal in size. Pericardium: Trivial pericardial effusion is present. Mitral Valve: The mitral valve is normal in structure. Trivial mitral valve regurgitation. No evidence of mitral valve stenosis. MV peak gradient, 4.5 mmHg. The mean mitral valve gradient is 2.0 mmHg. Tricuspid Valve: The tricuspid valve is not well visualized. Tricuspid valve regurgitation is not demonstrated. Aortic Valve: The aortic valve is tricuspid. Aortic valve regurgitation is not visualized. No aortic stenosis is present. Aortic valve mean gradient measures 5.0 mmHg. Aortic valve peak gradient measures 11.0 mmHg. Aortic valve area, by VTI measures 2.32  cm. Pulmonic Valve: The pulmonic valve was normal in structure. Pulmonic valve regurgitation is trivial. No evidence of pulmonic stenosis. Aorta: The aortic root is normal in size and structure. Pulmonary Artery: The pulmonary artery is of normal size. Venous: The inferior vena cava is normal in size with greater than 50% respiratory variability, suggesting right atrial pressure of 3 mmHg. IAS/Shunts: The interatrial septum was not well visualized.  LEFT VENTRICLE PLAX 2D LVIDd:         4.51 cm  Diastology LVIDs:         2.72 cm  LV e' medial:    5.33 cm/s LV PW:         1.04 cm  LV E/e' medial:  18.9 LV IVS:        0.92 cm  LV e' lateral:   7.83 cm/s LVOT diam:     1.90 cm  LV E/e' lateral: 12.9 LV SV:         84 LV SV Index:   37 LVOT Area:     2.84 cm  RIGHT VENTRICLE RV Basal diam:  3.48 cm LEFT ATRIUM             Index       RIGHT ATRIUM           Index LA diam:        3.20 cm 1.41 cm/m  RA Area:     12.40 cm LA Vol (A2C):   41.5 ml 18.33 ml/m RA Volume:   25.80 ml  11.39 ml/m LA Vol (A4C):   67.1 ml 29.63 ml/m LA Biplane Vol: 57.0 ml 25.17 ml/m  AORTIC VALVE  PULMONIC VALVE AV Area (Vmax):    2.27 cm    PV Vmax:       1.29 m/s AV Area (Vmean):   2.36 cm    PV Vmean:      76.100 cm/s AV Area (VTI):     2.32 cm    PV VTI:        0.264 m AV  Vmax:           166.00 cm/s PV Peak grad:  6.7 mmHg AV Vmean:          99.800 cm/s PV Mean grad:  3.0 mmHg AV VTI:            0.361 m AV Peak Grad:      11.0 mmHg AV Mean Grad:      5.0 mmHg LVOT Vmax:         133.00 cm/s LVOT Vmean:        83.200 cm/s LVOT VTI:          0.295 m LVOT/AV VTI ratio: 0.82  AORTA Ao Root diam: 3.40 cm MITRAL VALVE MV Area (PHT): 2.56 cm     SHUNTS MV Peak grad:  4.5 mmHg     Systemic VTI:  0.30 m MV Mean grad:  2.0 mmHg     Systemic Diam: 1.90 cm MV Vmax:       1.06 m/s MV Vmean:      55.7 cm/s MV Decel Time: 296 msec MV E velocity: 101.00 cm/s MV A velocity: 79.50 cm/s MV E/A ratio:  1.27 Yvonne Kendall MD Electronically signed by Yvonne Kendall MD Signature Date/Time: 04/14/2020/1:01:22 PM    Final     Scheduled Meds:  Continuous Infusions:   LOS: 0 days     Darlin Drop, MD [facility] Pager [phone]  If 7PM-7AM, please contact night-coverage [email or web address] Password [email or web address] 04/14/2020, 3:30 PM

## 2020-04-14 NOTE — Evaluation (Signed)
Speech Language Pathology Evaluation Patient Details Name: Rebekah Davenport MRN: [id number] DOB: 09-03-1965 Today's Date: 04/14/2020 Time: 7425-9563 SLP Time Calculation (min) (ACUTE ONLY): 17 min  Problem List:  Patient Active Problem List   Diagnosis Date Noted   Acute encephalopathy 04/14/2020   Past Medical History:  Past Medical History:  Diagnosis Date   Asthma    Heart murmur    Past Surgical History:  Past Surgical History:  Procedure Laterality Date   CYST EXCISION     upper back   PARTIAL HYSTERECTOMY     HPI:  Rebekah Davenport is a 54 y.o. femalewho presents to the ED for evaluation of altered mental status. Patient typically cares for their father at home, and patient is reportedly been forgetting multiple tasks that she is accustomed to.  Sister reports concern for slow and relatively unsteady gait, forgetfulness and "just not acting right." No past medical history.    Assessment / Plan / Recommendation Clinical Impression  Pt's baseline cognitive abilities are unknown. Pt is guarded throughout assessment but is alert and oriented x 4, navigating her cell phone and talking to people conveying accurate information, states that she lives with her father, he drives and takes them to church, she doesn't manage any money nor does she take any medicine. At baseline, pt reports poor health literacy. At this time, it is difficult to identify any acute cognitive deficits but pt appears to be close to baseline. ST intervention is not indicated during acute hospitalization. Should family notice any further cognitive deficits, pt and family can seek referral from PCP.     SLP Assessment  SLP Recommendation/Assessment: Patient does not need any further Speech Lanaguage Pathology Services SLP Visit Diagnosis: Cognitive communication deficit (R41.841)    Follow Up Recommendations  None    Frequency and Duration   N/A        SLP Evaluation Cognition  Overall Cognitive  Status: No family/caregiver present to determine baseline cognitive functioning Arousal/Alertness: Awake/alert Orientation Level: Oriented X4 Attention: Selective Selective Attention: Appears intact Memory: Appears intact Awareness: Appears intact Behaviors:  (appears guarded and irritated) Safety/Judgment: Impaired       Comprehension  Auditory Comprehension Overall Auditory Comprehension: Appears within functional limits for tasks assessed Visual Recognition/Discrimination Discrimination: Within Function Limits Reading Comprehension Reading Status: Not tested    Expression Expression Primary Mode of Expression: Verbal Verbal Expression Overall Verbal Expression: Appears within functional limits for tasks assessed   Oral / Motor  Oral Motor/Sensory Function Overall Oral Motor/Sensory Function: Within functional limits Motor Speech Overall Motor Speech: Appears within functional limits for tasks assessed   GO                   [name] B. Dreama Saa M.S., CCC-SLP, [facility] Speech-Language Pathologist Rehabilitation Services Office [phone]  Nikitta Sobiech Dreama Saa 04/14/2020, 9:00 PM

## 2020-04-14 NOTE — Evaluation (Signed)
Clinical/Bedside Swallow Evaluation Patient Details  Name: Rebekah Davenport MRN: [id number] Date of Birth: June 04, 1966  Today's Date: 04/14/2020 Time: SLP Start Time (ACUTE ONLY): 3810 SLP Stop Time (ACUTE ONLY): 0938 SLP Time Calculation (min) (ACUTE ONLY): 13 min  Past Medical History:  Past Medical History:  Diagnosis Date  . Asthma   . Heart murmur    Past Surgical History:  Past Surgical History:  Procedure Laterality Date  . CYST EXCISION     upper back  . PARTIAL HYSTERECTOMY     HPI:  Rebekah Davenport is a 54 y.o. femalewho presents to the ED for evaluation of altered mental status. Patient typically cares for their father at home, and patient is reportedly been forgetting multiple tasks that she is accustomed to.  Sister reports concern for slow and relatively unsteady gait, forgetfulness and "just not acting right." No past medical history.    Assessment / Plan / Recommendation Clinical Impression  Pt appears at reduced risk of aspiration when consuming regular diet with thin liquids via straw as pt didn't exhibit any overt s/s of oropharyngeal dysphagia or aspiration when consuming these items. Pt's voice remained clear throughout with no signs of respiratory change. At this time, recommend continuing current diet of regular with thin liquids via straw, medicine whole with thin liquids. ST intervention is not indicated at this time. SLP Visit Diagnosis: Dysphagia, unspecified (R13.10)    Aspiration Risk  No limitations    Diet Recommendation Regular;Thin liquid   Liquid Administration via: Straw Medication Administration: Whole meds with liquid Supervision: Patient able to self feed Compensations: Minimize environmental distractions;Slow rate;Small sips/bites Postural Changes: Seated upright at 90 degrees    Other  Recommendations Oral Care Recommendations: Oral care BID   Follow up Recommendations None      Frequency and Duration            Prognosis         Swallow Study   General Date of Onset: 04/13/20 HPI: Rebekah Davenport is a 54 y.o. femalewho presents to the ED for evaluation of altered mental status. Patient typically cares for their father at home, and patient is reportedly been forgetting multiple tasks that she is accustomed to.  Sister reports concern for slow and relatively unsteady gait, forgetfulness and "just not acting right." No past medical history.  Type of Study: Bedside Swallow Evaluation Previous Swallow Assessment: none in chart Diet Prior to this Study: NPO Temperature Spikes Noted: No Respiratory Status: Room air History of Recent Intubation: No Behavior/Cognition: Alert Oral Cavity Assessment: Within Functional Limits Oral Care Completed by SLP: No Oral Cavity - Dentition: Adequate natural dentition Vision: Functional for self-feeding Self-Feeding Abilities: Able to feed self Patient Positioning:  (sitting edge of bed) Baseline Vocal Quality: Normal Volitional Cough: Strong Volitional Swallow: Able to elicit    Oral/Motor/Sensory Function Overall Oral Motor/Sensory Function: Within functional limits   Ice Chips Ice chips: Not tested   Thin Liquid Thin Liquid: Within functional limits Presentation: Cup;Self Fed;Straw    Nectar Thick Nectar Thick Liquid: Not tested   Honey Thick Honey Thick Liquid: Not tested   Puree Puree: Within functional limits Presentation: Self Fed;Spoon   Solid     Solid: Within functional limits Presentation: Self Fed     Aryon Nham B. Dreama Saa M.S., CCC-SLP, [facility] Speech-Language Pathologist Rehabilitation Services Office [phone]  Pate Aylward 04/14/2020,8:49 PM

## 2020-04-14 NOTE — ED Notes (Signed)
Pt to MRI

## 2020-04-14 NOTE — ED Notes (Signed)
Attempted report x1. 

## 2020-04-14 NOTE — Plan of Care (Signed)
  Problem: Education: Goal: Knowledge of secondary prevention will improve Outcome: Progressing Goal: Knowledge of patient specific risk factors addressed and post discharge goals established will improve Outcome: Progressing   

## 2020-04-14 NOTE — Progress Notes (Signed)
Pt AAox4, VS stable, No complaints today. Pt went for [place] and echo. Will have CT scan later. Pt independently with stand by. On regular diet with good appetite. Will continue to monitor.

## 2020-04-14 NOTE — Progress Notes (Addendum)
OT Cancellation Note  Patient Details Name: Rebekah Davenport MRN: [id number] DOB: [birth year]   Cancelled Treatment:    Reason Eval/Treat Not Completed: OT screened, no needs identified, will sign off. Order received and chart reviewed. Spoke with RN who states pt has been up to bathroom w/o physical assist required. Upon arrival to pt room, pt seated EOB eating from lunch tray. Pt with limited engagement with therapist. She provides primarily 1-word answers t/o. She denies focal weakness or functional deficits at this time. She states symptoms have resolved and she feels back to baseline level of functional independence for ADL/IADL management. No skilled OT needs identified. Will sign off at this time. Please re-consult if additional needs arise during this admission.   Rockney Ghee, M.S., OTR/L Ascom: [phone] 04/14/20, 2:13 PM

## 2020-04-14 NOTE — Progress Notes (Signed)
*  PRELIMINARY RESULTS* Echocardiogram 2D Echocardiogram has been performed.  Rebekah Davenport 04/14/2020, 9:10 AM

## 2020-04-15 ENCOUNTER — Encounter: Payer: Self-pay | Admitting: Internal Medicine

## 2020-04-15 ENCOUNTER — Inpatient Hospital Stay: Payer: Self-pay

## 2020-04-15 DIAGNOSIS — R410 Disorientation, unspecified: Secondary | ICD-10-CM

## 2020-04-15 LAB — CBC
HCT: 42.3 % (ref 36.0–46.0)
Hemoglobin: 14.3 g/dL (ref 12.0–15.0)
MCH: 30.1 pg (ref 26.0–34.0)
MCHC: 33.8 g/dL (ref 30.0–36.0)
MCV: 89.1 fL (ref 80.0–100.0)
Platelets: 243 10*3/uL (ref 150–400)
RBC: 4.75 MIL/uL (ref 3.87–5.11)
RDW: 13.8 % (ref 11.5–15.5)
WBC: 6.7 10*3/uL (ref 4.0–10.5)
nRBC: 0 % (ref 0.0–0.2)

## 2020-04-15 LAB — BASIC METABOLIC PANEL
Anion gap: 8 (ref 5–15)
BUN: 10 mg/dL (ref 6–20)
CO2: 23 mmol/L (ref 22–32)
Calcium: 8.7 mg/dL — ABNORMAL LOW (ref 8.9–10.3)
Chloride: 109 mmol/L (ref 98–111)
Creatinine, Ser: 0.47 mg/dL (ref 0.44–1.00)
GFR, Estimated: >60 mL/min (ref >60)
Glucose, Bld: 95 mg/dL (ref 70–99)
Potassium: 3.7 mmol/L (ref 3.5–5.1)
Sodium: 140 mmol/L (ref 135–145)

## 2020-04-15 IMAGING — CT CT ANGIO HEAD
3 of 10 series · 15 of 47 positions shown · IV contrast (APPLIED)
Comparison: MR angiogram [DATE]. MRI of the brain LESYA

CLINICAL DATA: Stroke/TIA.  Assess intracranial arteries.

EXAM:
CT ANGIOGRAPHY HEAD
TECHNIQUE: Multidetector CT imaging of the head was performed using the
standard protocol during bolus administration of intravenous
contrast. Multiplanar CT image reconstructions and MIPs were
obtained to evaluate the vascular anatomy.
CONTRAST:  75mL OMNIPAQUE IOHEXOL 350 MG/ML SOLN

[Series 10: ax thin · axial · 0.34mm/px · z∈[+279,+412]mm · 9 of 159 slices shown]
[im 11/159  brain]
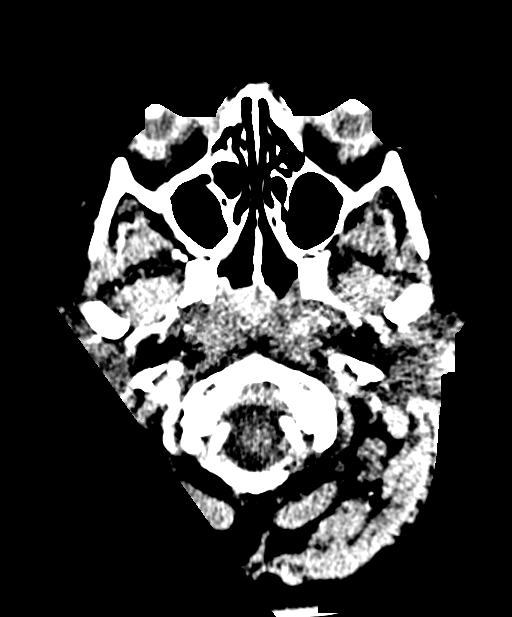
[im 32/159  bone]
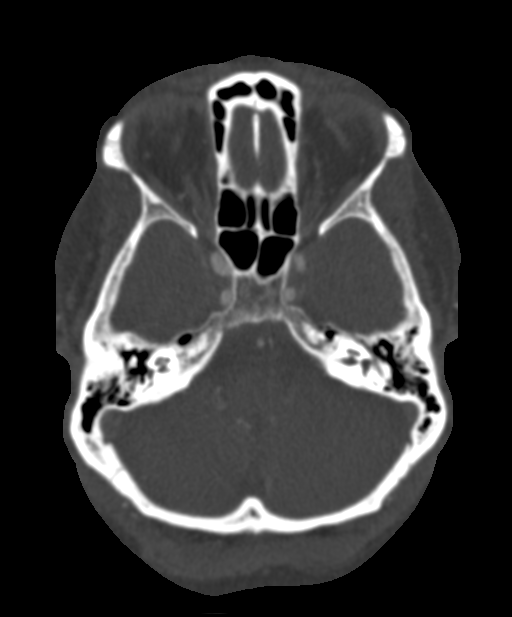
[im 43/159  brain]
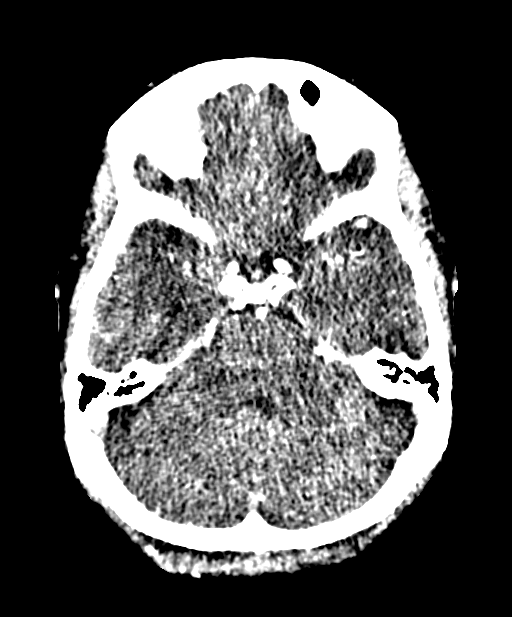
[im 64/159  bone]
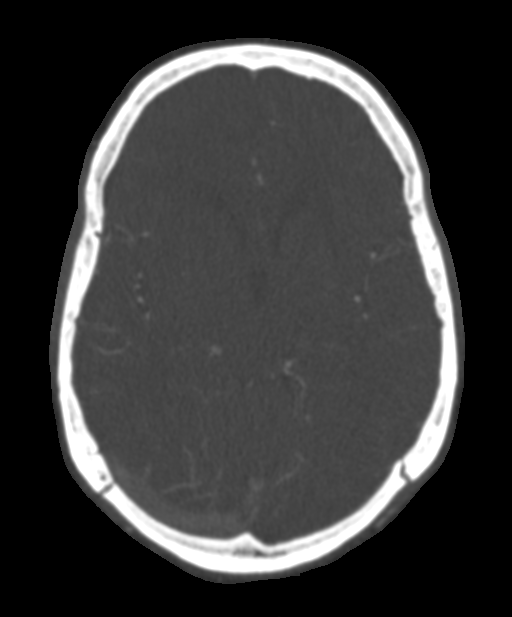
[im 85/159  brain]
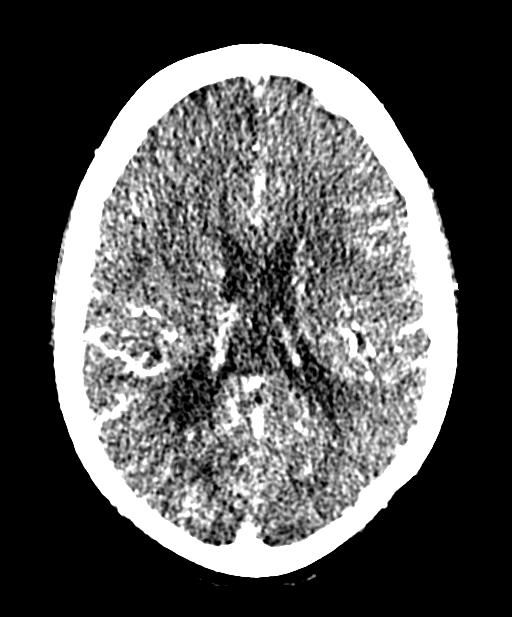
[im 95/159  bone]
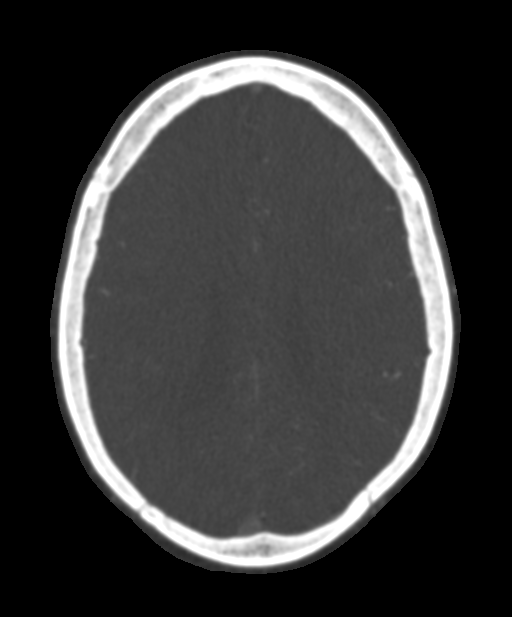
[im 116/159  brain]
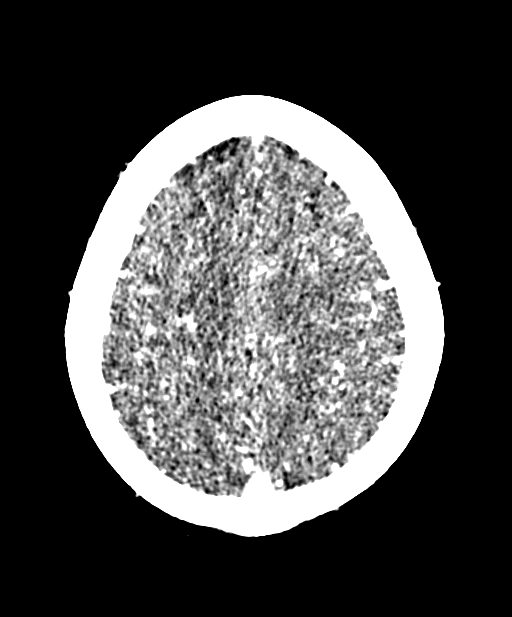
[im 127/159  bone]
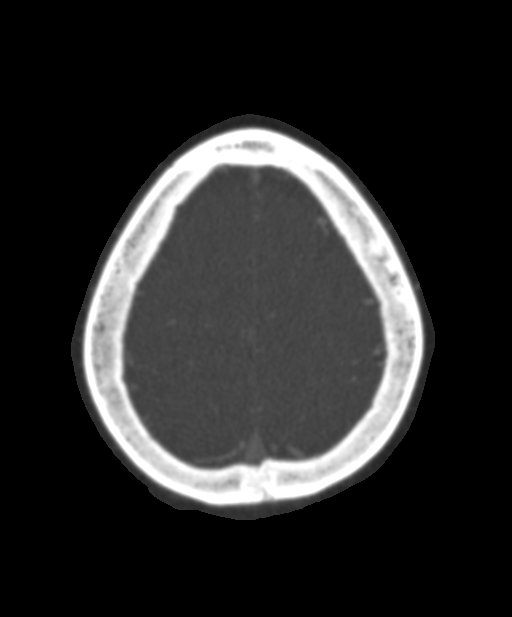
[im 148/159  brain]
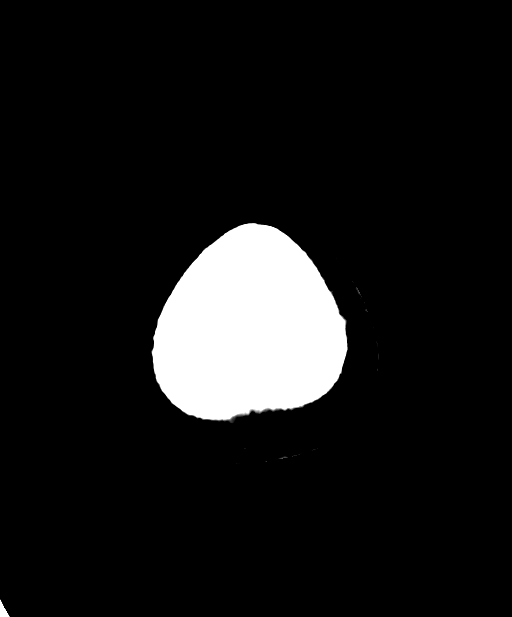

[Series 12: cor thin · coronal · 0.31mm/px · 3 of 186 slices shown]
[im 62/186  brain]
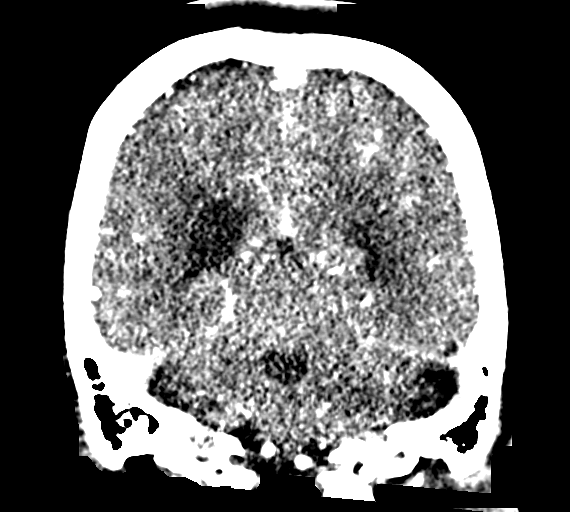
[im 93/186  brain]
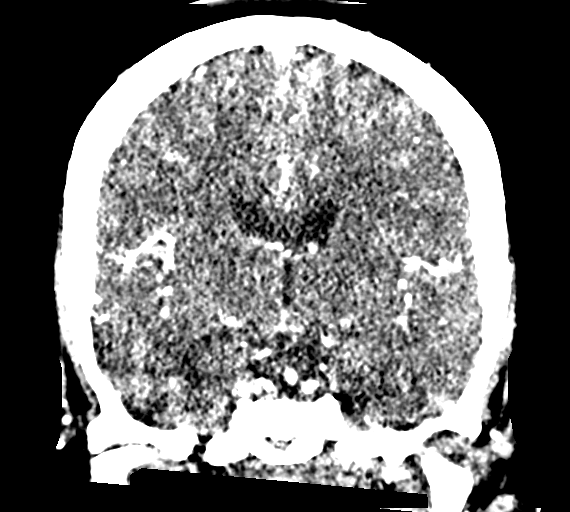
[im 124/186  brain]
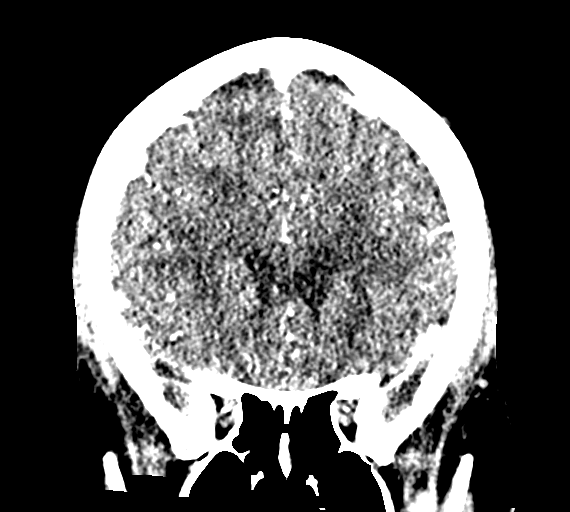

[Series 14: sag thin · sagittal · 0.31mm/px · 3 of 157 slices shown]
[im 40/157  brain]
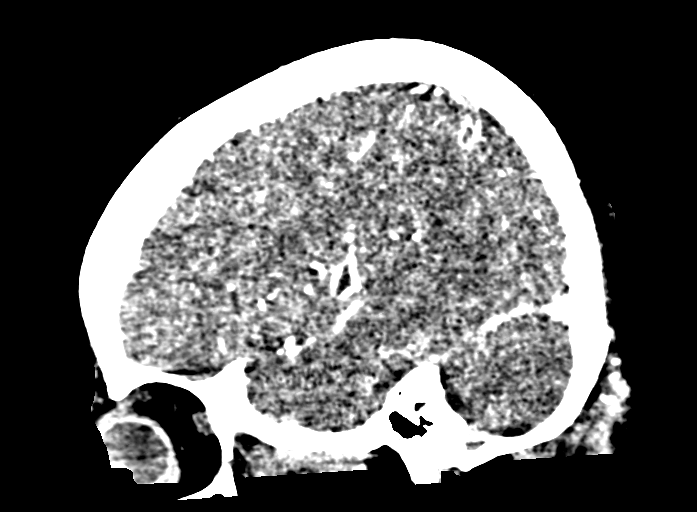
[im 79/157  brain]
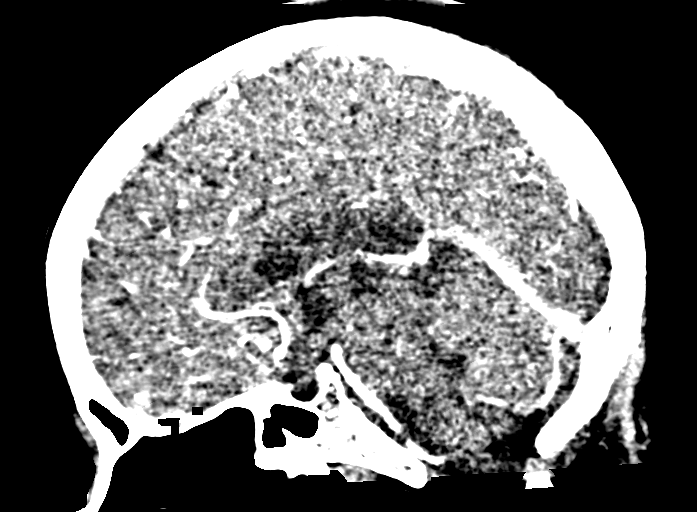
[im 118/157  brain]
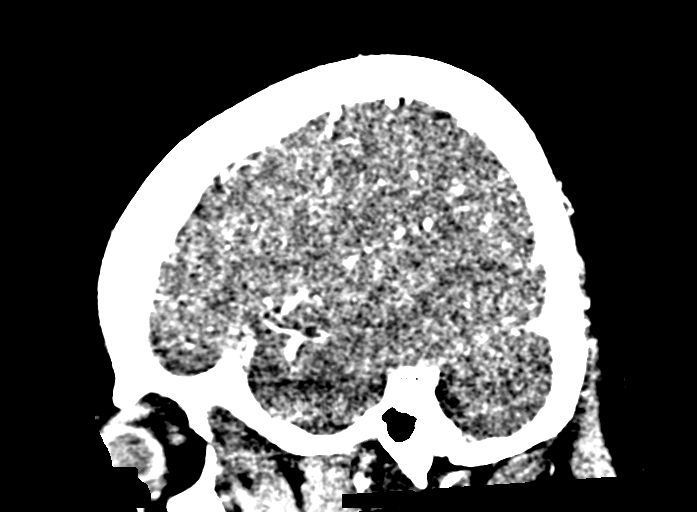

[15 of 47 positions shown; findings below may reference images not displayed]

FINDINGS: CT HEAD

Brain: Hypodensity within the right superior frontal gyrus,
corresponding to acute on chronic infarct better appreciated on
prior MRI performed on [DATE]. Confluent hypodensity of
the white matter of the cerebral hemispheres, nonspecific, more
pronounced than expected for patient's age.

Vascular: No hyperdense vessel or unexpected calcification.

Skull: No fracture or destructive lesion. Mastoids and middle ears
are clear.

Sinuses: Imaged portions are clear.

Orbits: No acute finding.

CTA HEAD

Anterior circulation: Diffuse luminal irregularity of the right
carotid siphon, suggestive of atherosclerotic disease, without
hemodynamically significant stenosis. There is also diffuse
irregularity the left carotid siphon, extending into the
supraclinoid segment where there is a focal area approximately 50%
stenosis.

Mild luminal irregularity throughout the bilateral MCA vascular
trees, suggestive intracranial atherosclerotic disease without
significant stenosis.

Diffuse luminal irregularity of the bilateral ACA vascular tree
consistent intracranial atherosclerotic disease. Severe stenosis at
the origin of the right A1/ACA. Dominant left A1/ACA. Occlusion of
the proximal right A2/ACA with reconstitution at the A3 segment and
a faint opacification distally. Mild attenuation the left ACA
vascular tree.

Posterior circulation: Luminal irregularity is seen in the
intracranial vertebral arteries and basilar artery without
hemodynamically significant stenosis. Fenestration of the proximal
basilar artery is noted. There is also irregularity throughout the
bilateral posterior cerebral arteries with area of moderate stenosis
at the P2 segment of the right posterior cerebral artery.

Venous sinuses: As permitted by contrast timing, patent.

Anatomic variants: Basilar artery fenestration.

Findings are stable since prior MRA.
IMPRESSION: 1. Diffuse intracranial luminal irregularity suggestive of
atherosclerotic disease.
2. Severe stenosis at the origin of the right A1/ACA with occlusion
at the proximal right A2 segment.
3. Moderate stenosis at the P2 segment of the right posterior
cerebral artery.
4. Approximately 50% stenosis of the supraclinoid left ICA.
5. Acute on chronic right superior frontal gyrus infarct.
6. Confluent hypodensity of the white matter of the cerebral
hemispheres, nonspecific, may reflect advanced chronic microvascular
ischemic disease.

## 2020-04-15 MED ORDER — ASPIRIN 325 MG PO TABS: 325.0000 mg | ORAL_TABLET | Freq: Every day | ORAL | 0 refills | Status: AC

## 2020-04-15 MED ORDER — ATORVASTATIN CALCIUM 80 MG PO TABS: 80.0000 mg | ORAL_TABLET | Freq: Every day | ORAL | 0 refills | Status: AC

## 2020-04-15 MED ORDER — CLOPIDOGREL BISULFATE 75 MG PO TABS: 75.0000 mg | ORAL_TABLET | Freq: Every day | ORAL | 0 refills | Status: AC

## 2020-04-15 MED ORDER — IOHEXOL 350 MG/ML SOLN
75.0000 mL | Freq: Once | INTRAVENOUS | Status: AC | PRN
  Administered 2020-04-15: 12:00:00 75 mL via INTRAVENOUS

## 2020-04-15 NOTE — Plan of Care (Signed)
  Problem: Education: Goal: Knowledge of secondary prevention will improve Outcome: Progressing   Problem: Education: Goal: Knowledge of patient specific risk factors addressed and post discharge goals established will improve Outcome: Progressing   

## 2020-04-15 NOTE — Progress Notes (Signed)
Mews fired yellow because of HR and respirations. Patient is resting in bed. Easily awakened. Patient HR and respirations rechecked at 0500- Mews fired green. Will continue to take vitals routinely. No distress noted.

## 2020-04-15 NOTE — Progress Notes (Signed)
   04/15/20 0506  Assess: MEWS Score  Temp (!) 97.5 F (36.4 C)  BP 131/62  Pulse Rate (!) 50  Resp 18  SpO2 100 %  O2 Device Room Air  Assess: MEWS Score  MEWS Temp 0  MEWS Systolic 0  MEWS Pulse 1  MEWS RR 0  MEWS LOC 0  MEWS Score 1  MEWS Score Color Green  Vital signs rechecked pt is a green MEWS

## 2020-04-15 NOTE — Progress Notes (Signed)
OT Cancellation Note  Patient Details Name: Rebekah Davenport MRN: [id number] DOB: 05/24/1966   Cancelled Treatment:    Reason Eval/Treat Not Completed: OT screened, no needs identified, will sign off. Duplicate order received. Per OT note from 04/14/20, pt denies any acute focal weakness or difficulty with ADL management. She is ambulating to the bathroom w/o physical assist. Spoke with physical therapist who saw pt for evaluation this am, and no changes in pt status have been noted. Per PT she is able to don socks independently, continues to use the bathroom without assist, and denies functional deficits at this time. No skilled OT needs identified. Will complete order at this time.   Rockney Ghee, M.S., OTR/L Ascom: [phone] 04/15/20, 9:48 AM

## 2020-04-15 NOTE — TOC Transition Note (Signed)
Transition of Care [facility]) - CM/SW Discharge Note   Patient Details  Name: Rebekah Davenport MRN: [id number] Date of Birth: 02-19-1966  [facility] CM/SW Contact:  Allayne Butcher, RN Phone Number: 04/15/2020, 1:59 PM   Clinical Narrative:     Patient will discharge home this afternoon.  RNCM was able to speak with patient at the bedside.  Patient verifies that she does not currently have any insurance and that she does not have a PCP.  Patient provided with Purple Book of  Free and low cost health care options in [facility] and an application for the [employer].  Patient gives consent for this RNCM to send referral to [facility].  Patient uses [facility] and has not had any trouble affording prescriptions.  Patient's sister will pick her up this afternoon.     Barriers to Discharge: No Barriers Identified   Patient Goals and CMS Choice Patient states their goals for this hospitalization and ongoing recovery are:: Glad to be going home this afternoon      Discharge Placement                       Discharge Plan and Services   Discharge Planning Services: CM Consult, [facility]                                 Social Determinants of Health (SDOH) Interventions     Readmission Risk Interventions No flowsheet data found.

## 2020-04-15 NOTE — Evaluation (Signed)
Physical Therapy Evaluation Patient Details Name: Rebekah Davenport MRN: [id number] DOB: [birth year] Today's Date: 04/15/2020   History of Present Illness  Pt is a 54 y.o. female presenting to hospital 10/18 with AMS, slow unsteady gait, and forgetfulness.  Imaging showing small acute on chronic R ACA territory infarct, occluded R ACA, and severe stenosis distal L ICA.  PMH includes asthma and partial hysterectomy.  Clinical Impression  Prior to hospital admission, pt was independent with functional mobility; lives with and helps take care of her dad.  Currently pt is independent with bed mobility and transfers and SBA with ambulation 150 feet (no AD).  Pt's HR 47-53 bpm at rest and with ambulation pt's HR noted to increase to 112-130 bpm on telemetry briefly (pt then cued to have standing rest break and HR would fairly quickly go back to 50's bpm within 15-20 seconds); question if telemetry reading accurately; (nurse notified of above HR).  BP 172/89 post ambulation (nurse notified).  Flat affect noted throughout therapy session and pt reporting just wanting to go home.  Pt steady and safe with functional mobility during sessions activities.  Pt would benefit from skilled PT to address noted impairments and functional limitations during hospitalization (see below for any additional details).  Upon hospital discharge, no further PT needs anticipated.    Follow Up Recommendations No PT follow up    Equipment Recommendations  None recommended by PT    Recommendations for Other Services       Precautions / Restrictions Restrictions Weight Bearing Restrictions: No      Mobility  Bed Mobility Overal bed mobility: Independent             General bed mobility comments: no difficulties noted    Transfers Overall transfer level: Independent Equipment used: None             General transfer comment: steady safe transfers noted  Ambulation/Gait Ambulation/Gait assistance:  Supervision (SBA for IV pole management) Gait Distance (Feet): 150 Feet Assistive device: None Gait Pattern/deviations: Step-through pattern Gait velocity: mildly decreased   General Gait Details: steady ambulation  Stairs            Wheelchair Mobility    Modified Rankin (Stroke Patients Only)       Balance Overall balance assessment: Needs assistance Sitting-balance support: No upper extremity supported;Feet supported Sitting balance-Leahy Scale: Normal Sitting balance - Comments: steady sitting reaching outside BOS   Standing balance support: No upper extremity supported;During functional activity Standing balance-Leahy Scale: Good Standing balance comment: no loss of balance with ambulation and dynamic standing activities during session                             Pertinent Vitals/Pain Pain Assessment: No/denies pain  O2 sats WFL on room air during sessions activities.    Home Living Family/patient expects to be discharged to:: Private residence Living Arrangements: Parent (pt's dad) Available Help at Discharge: Family Type of Home: House Home Access: Stairs to enter Entrance Stairs-Rails: Right;Left;Can reach both Secretary/administrator of Steps: 4-5 Home Layout: One level Home Equipment: None      Prior Function Level of Independence: Independent         Comments: (+) driving; takes care of her dad (pt reports no physical assistance but is around to assist as needed)     Hand Dominance        Extremity/Trunk Assessment   Upper Extremity Assessment Upper Extremity  Assessment: Overall WFL for tasks assessed    Lower Extremity Assessment Lower Extremity Assessment:  (intact B LE light touch, proprioception, tone, heel to shin coordination, and strength)    Cervical / Trunk Assessment Cervical / Trunk Assessment: Normal  Communication   Communication: No difficulties  Cognition Arousal/Alertness: Awake/alert Behavior During  Therapy: Flat affect Overall Cognitive Status: Within Functional Limits for tasks assessed                                 General Comments: Oriented to person, place, time, and situation      General Comments   Nursing cleared pt for participation in physical therapy.  Pt agreeable to PT session.    Exercises     Assessment/Plan    PT Assessment Patient needs continued PT services  PT Problem List Decreased mobility;Cardiopulmonary status limiting activity       PT Treatment Interventions Gait training;Stair training;Balance training;Therapeutic exercise;Functional mobility training;Therapeutic activities;Patient/family education    PT Goals (Current goals can be found in the Care Plan section)  Acute Rehab PT Goals Patient Stated Goal: to go home PT Goal Formulation: With patient Time For Goal Achievement: 04/29/20 Potential to Achieve Goals: Good    Frequency Min 2X/week   Barriers to discharge        Co-evaluation               AM-PAC PT "6 Clicks" Mobility  Outcome Measure Help needed turning from your back to your side while in a flat bed without using bedrails?: None Help needed moving from lying on your back to sitting on the side of a flat bed without using bedrails?: None Help needed moving to and from a bed to a chair (including a wheelchair)?: None Help needed standing up from a chair using your arms (e.g., wheelchair or bedside chair)?: None Help needed to walk in hospital room?: None Help needed climbing 3-5 steps with a railing? : None 6 Click Score: 24    End of Session Equipment Utilized During Treatment: Gait belt Activity Tolerance: Patient tolerated treatment well Patient left: in bed;with call bell/phone within reach Nurse Communication: Mobility status;Precautions;Other (comment) (pt's HR and BP during/end of session) PT Visit Diagnosis: Unsteadiness on feet (R26.81)    Time: 7425-9563 PT Time Calculation (min) (ACUTE  ONLY): 25 min   Charges:   PT Evaluation $PT Eval Low Complexity: 1 Low PT Treatments $Therapeutic Activity: 8-22 mins       Hendricks Limes, PT 04/15/20, 9:25 AM

## 2020-04-15 NOTE — Plan of Care (Signed)
  Problem: Education: Goal: Knowledge of secondary prevention will improve Outcome: Adequate for Discharge Goal: Knowledge of patient specific risk factors addressed and post discharge goals established will improve Outcome: Adequate for Discharge   

## 2020-04-16 LAB — HEMOGLOBIN A1C
Hgb A1c MFr Bld: 5.4 % (ref 4.8–5.6)
Mean Plasma Glucose: 108 mg/dL

## 2020-04-16 NOTE — Discharge Summary (Signed)
[facility] Discharge Summary   Patient: Rebekah AblesDeborah L Davenport ZOX:[id number]  PCP: Patient, No Pcp Per  Date of admission: 04/13/2020   Date of discharge: 04/15/2020      Discharge Diagnoses:  Principal diagnosis Subacute ischemic CVA  Active Problems:   Acute encephalopathy   Admitted From: home Disposition:  Home   Recommendations for Outpatient Follow-up:  1. PCP: please follow up with PCP in 1 week 2. Follow up LABS/TEST:  none   Diet recommendation: Cardiac diet  Activity: The patient is advised to gradually reintroduce usual activities, as tolerated  Discharge Condition: stable  Code Status: Full code   History of present illness: As per the H and P dictated on admission, "Rebekah AblesDeborah L Tuckey is a 54 y.o. female with medical history significant of  COPD, heart murmur  Presented with   patient became forgetful at home started to urinate on herself which is not usual for her.  She is not complaining of any urinary complaints no pain but clearly more altered than her baseline was brought to emergency department by her sister Have had subacute changes forgetting things that she usually does."  Hospital Course:  Summary of her active problems in the hospital is as following.  Subacute Right ACA territory ischemic CVA Presented with confusion and gait abnormality for > 1 week per family MRI showed subacute R ACA ischemic CVA.   Extensive chronic microvascular ischemic changes were also noted. Admitted for stroke workup 2D echo normal LVEF 60-65% Grade IIDD LDL 112 goal <70 A1C 5.5 MR Angio 1. 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 2. Positive also for evidence of widespread severe intracranial atherosclerosis with hemodynamically significant stenoses: - Severe stenosis distal Left ICA. - patent Left ACA moderately to severely attenuated throughout. - Moderate multifocal stenoses Right PCA. Vascular surgery consulted and recommended no inpatient work up and  outpatient referral. For now pt wants to stay close to  for her care [facility]  Will place IR referral as well as neuro referral for [place].  Start ASA 325 mg daily and Lipitor 80 mg daily  Acute metabolic encephalopathy 2/2 to above Treat underlying condition Orient as needed  HLD LDL 112, goal <70 Lipitor 80 mg daily  Ambulatory dysfunction post stroke PT OT suggest no pt needed outpatient.  Fall precautions  Morbid obesity  Placing the pt at poor risk. Needs outpatient dietary consultation   Body mass index is 45.75 kg/m.   Patient was seen by physical therapy, who recommended Home health,  was arranged. On the day of the discharge the patient's vitals were stable, and no other acute medical condition were reported by patient. The patient was felt safe to be discharge at Home with Home health.  Consultants: neurology  Procedures: Echocardiogram   Discharge Exam: General: Appear in no distress, no Rash; Oral Mucosa Clear, moist. no Abnormal Neck Mass Or lumps, Conjunctiva normal  Cardiovascular: S1 and S2 Present, no Murmur Respiratory: good respiratory effort, Bilateral Air entry present and CTA, no Crackles, no wheezes Abdomen: Bowel Sound present, Soft and no tenderness Extremities: no Pedal edema Neurology: alert and oriented to time, place, and person affect appropriate. no new focal deficit  Filed Weights   04/13/20 1633  Weight: 124.7 kg   Vitals:   04/15/20 0806 04/15/20 1133  BP: (!) 148/82 (!) 146/100  Pulse: (!) 47 (!) 49  Resp: 17 19  Temp: 98.1 F (36.7 C) 98.1 F (36.7 C)  SpO2: 100% 99%    DISCHARGE MEDICATION: Allergies as of 04/15/2020   No Known Allergies     Medication List    TAKE these medications   aspirin 325 MG tablet Take 1 tablet (325 mg total) by mouth daily.   atorvastatin 80 MG tablet Commonly known as: LIPITOR Take 1 tablet (80 mg total) by mouth daily.   clopidogrel 75 MG  tablet Commonly known as: PLAVIX Take 1 tablet (75 mg total) by mouth daily.      No Known Allergies Discharge Instructions    Ambulatory referral to Interventional Radiology   Complete by: As directed    Ambulatory referral to Neurology   Complete by: As directed    Diet - low sodium heart healthy   Complete by: As directed    Increase activity slowly   Complete by: As directed       The results of significant diagnostics from this hospitalization (including imaging, microbiology, ancillary and laboratory) are listed below for reference.    Significant Diagnostic Studies: CT ANGIO HEAD W OR WO CONTRAST  Result Date: 04/15/2020 CLINICAL DATA:  Stroke/TIA.  Assess intracranial arteries. EXAM: CT ANGIOGRAPHY HEAD TECHNIQUE: Multidetector CT imaging of the head was performed using the standard protocol during bolus administration of intravenous contrast. Multiplanar CT image reconstructions and MIPs were obtained to evaluate the vascular anatomy. CONTRAST:  75mL OMNIPAQUE IOHEXOL 350 MG/ML SOLN COMPARISON:  MR angiogram April 14, 2020. MRI of the brain April 13, 2020. FINDINGS: CT HEAD Brain: Hypodensity within the right superior frontal gyrus, corresponding to acute on chronic infarct better appreciated on prior MRI performed on April 13, 2020. Confluent hypodensity of the white matter of the cerebral hemispheres, nonspecific, more pronounced than expected for patient's age. Vascular: No hyperdense vessel or unexpected calcification. Skull: No fracture or destructive lesion. Mastoids and middle ears are clear. Sinuses: Imaged portions are clear. Orbits: No acute finding. CTA HEAD Anterior circulation: Diffuse luminal irregularity of the right carotid siphon, suggestive of atherosclerotic disease, without hemodynamically significant stenosis. There is also diffuse irregularity the left carotid siphon, extending into the supraclinoid segment where there is a focal area approximately 50%  stenosis. Mild luminal irregularity throughout the bilateral MCA vascular trees, suggestive intracranial atherosclerotic disease without significant stenosis. Diffuse luminal irregularity of the bilateral ACA vascular tree consistent intracranial atherosclerotic disease. Severe stenosis at the origin of the right A1/ACA. Dominant left A1/ACA. Occlusion of the proximal right A2/ACA with reconstitution at the A3 segment and a faint opacification distally. Mild attenuation the left ACA vascular tree. Posterior circulation: Luminal irregularity is seen in the intracranial vertebral arteries and basilar artery without hemodynamically significant stenosis. Fenestration of the proximal basilar artery is noted. There is also irregularity throughout the bilateral posterior cerebral arteries with area of moderate stenosis at the P2 segment of the right posterior cerebral artery. Venous sinuses: As permitted by contrast timing, patent. Anatomic variants: Basilar artery fenestration. Findings are stable since prior MRA. IMPRESSION: 1. Diffuse intracranial luminal irregularity suggestive of atherosclerotic disease. 2. Severe stenosis at the origin of the right A1/ACA with occlusion at the proximal right A2 segment. 3. Moderate stenosis at the P2 segment of the right posterior cerebral artery. 4. Approximately 50% stenosis of the supraclinoid left ICA. 5. 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 6. Confluent hypodensity of the white matter of the cerebral hemispheres, nonspecific, may reflect advanced chronic microvascular ischemic disease. Electronically Signed   By: Baldemar Lenis M.D.   On: 04/15/2020  13:04   DG Chest 2 View  Result Date: 04/13/2020 CLINICAL DATA:  Altered mental status EXAM: CHEST - 2 VIEW COMPARISON:  03/14/2016 FINDINGS: Cardiac shadow is within normal limits. The lungs are well aerated bilaterally. No focal infiltrate or sizable effusion is seen. Calcified granuloma is  noted in the right base. No bony abnormality is noted. IMPRESSION: No acute abnormality noted. Electronically Signed   By: Alcide Clever M.D.   On: 04/13/2020 23:19   CT Head Wo Contrast  Result Date: 04/13/2020 CLINICAL DATA:  Delirium.  Altered mental status EXAM: CT HEAD WITHOUT CONTRAST TECHNIQUE: Contiguous axial images were obtained from the base of the skull through the vertex without intravenous contrast. COMPARISON:  None. FINDINGS: Brain: Hypodensity throughout the frontal white matter bilaterally extending into the internal capsule. This is symmetric and appears chronic. Milder hypodensity in the parietal white matter bilaterally also appears chronic. Hypodensity right frontal lobe consistent with chronic cortical infarct. Ventricle size normal.  Negative for acute infarct, hemorrhage, mass Vascular: Negative for hyperdense vessel Skull: Negative Sinuses/Orbits: Paranasal sinuses clear.  Negative orbit Other: None IMPRESSION: Diffuse white matter changes. While nonspecific, this is likely due to chronic microvascular ischemic change. Correlate with risk factors. Chronic appearing infarct right frontal lobe No acute abnormality. Electronically Signed   By: Marlan Palau M.D.   On: 04/13/2020 17:04   CT ANGIO NECK W OR WO CONTRAST  Result Date: 04/14/2020 CLINICAL DATA:  Transient ischemic attack (TIA). EXAM: CT ANGIOGRAPHY NECK TECHNIQUE: Multidetector CT imaging of the neck was performed using the standard protocol during bolus administration of intravenous contrast. Multiplanar CT image reconstructions and MIPs were obtained to evaluate the vascular anatomy. Carotid stenosis measurements (when applicable) are obtained utilizing NASCET criteria, using the distal internal carotid diameter as the denominator. CONTRAST:  75mL OMNIPAQUE IOHEXOL 350 MG/ML SOLN COMPARISON:  No pertinent prior exams are available for comparison. FINDINGS: Aortic arch: Standard aortic branching. Streak artifact from a  dense left-sided contrast bolus partially obscures the left subclavian artery. Within this limitation, no evidence of hemodynamically significant innominate or proximal subclavian artery stenosis. Right carotid system: CCA and ICA patent within the neck without stenosis. No significant atherosclerotic disease. Left carotid system: CCA and ICA patent within the neck without stenosis. No significant atherosclerotic disease. Vertebral arteries: Codominant and patent within the neck without significant stenosis Skeleton: No acute bony abnormality or aggressive osseous lesion. Advanced C5-C6 spondylosis with severe disc space narrowing, a shallow posterior disc osteophyte complex and uncovertebral hypertrophy. No appreciable high-grade bony spinal canal or neural foraminal narrowing. Other neck: No neck mass or cervical lymphadenopathy. Upper chest: No consolidation within the imaged lung apices. 5 mm right upper lobe pulmonary nodule (series 6, image 66). IMPRESSION: The common carotid, internal carotid and vertebral arteries are patent within the neck without hemodynamically significant stenosis and without significant atherosclerotic disease. Please refer to the recent prior MRA of the head for a description of intracranial atherosclerotic disease and multifocal intracranial arterial stenoses. 5 mm right upper lobe pulmonary nodule. No follow-up needed if patient is low-risk. Non-contrast chest CT can be considered in 12 months if patient is high-risk. This recommendation follows the consensus statement: Guidelines for Management of Incidental Pulmonary Nodules Detected on CT Images: From the Fleischner Society 2017; Radiology 2017; 284:228-243. Electronically Signed   By: Jackey Loge DO   On: 04/14/2020 12:20   MR ANGIO HEAD WO CONTRAST  Addendum Date: 04/14/2020   ADDENDUM REPORT: 04/14/2020 02:17 ADDENDUM: Study discussed by telephone  with Dr. Para March on 04/14/2020 at 02:17 . Electronically Signed   By: Odessa Fleming M.D.   On: 04/14/2020 02:17   Result Date: 04/14/2020 CLINICAL DATA:  54 year old female with altered mental status and acute on chronic ischemia on brain MRI tonight. EXAM: MRA HEAD WITHOUT CONTRAST TECHNIQUE: Angiographic images of the Circle of Willis were obtained using MRA technique without intravenous contrast. COMPARISON:  Brain MRI earlier tonight.  Head CT FINDINGS: Antegrade flow in the posterior circulation. Fairly codominant distal vertebral arteries are patent to the basilar, with mildly fenestrated vertebrobasilar junction (normal variant). There is distal vertebral irregularity suggesting atherosclerosis, but no significant distal vertebral stenosis. Both PICA origins remain patent. Patent basilar artery with mild irregularity, no stenosis. Patent SCA and PCA origins. Right posterior communicating artery is present, the left is diminutive or absent. Left PCA branches are within normal limits. There is multifocal moderate irregularity and stenosis of the right PCA (series 1043, image 14) which remains patent. Antegrade flow in both ICA siphons. Probable artifactual loss of flow signal in the a petrous left ICA from asymmetric left petrous apex air cell susceptibility. The right ICA siphon is patent with mild irregularity. The left siphon is patent but with severe supraclinoid stenosis including a small flow gap of the distal ICA (series 1053, image 17). Carotid termini remain patent. MCA and ACA origins are patent. The left A1 appears dominant. Anterior communicating artery is within normal limits. The right ACA A2 segment is occluded just distal to the A-comm (series 1053, image 12). And the left A2/A3 is patent but with moderate to severe attenuation of the vessel throughout. Left MCA M1 segment is mildly irregular without stenosis. Left MCA bifurcation and visible left MCA branches are within normal limits. Right MCA M1 is patent with mild irregularity. There is moderate stenosis at the  right anterior temporal artery origin. Right MCA bifurcation is patent without stenosis. Possible moderate irregularity and stenosis of visible right MCA M3 branches. IMPRESSION: 1. 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 2. Positive also for evidence of widespread severe intracranial atherosclerosis with hemodynamically significant stenoses: - Severe stenosis distal Left ICA. - patent Left ACA moderately to severely attenuated throughout. - Moderate multifocal stenoses Right PCA. 3. Recommend Neuro endovascular consultation regarding the distal Left ICA stenosis (#2). Electronically Signed: By: Odessa Fleming M.D. On: 04/14/2020 02:04   MR BRAIN WO CONTRAST  Result Date: 04/13/2020 CLINICAL DATA:  54 year old female with altered mental status, delirium. EXAM: MRI HEAD WITHOUT CONTRAST TECHNIQUE: Multiplanar, multiecho pulse sequences of the brain and surrounding structures were obtained without intravenous contrast. COMPARISON:  Head CT earlier today. FINDINGS: Brain: Small foci of patchy restricted diffusion superimposed on encephalomalacia in the right anterior superior frontal gyrus, right ACA territory (series 5, image 40 and series 15, image 43). Underlying chronic cortical and white matter gliosis and mild hemosiderin. No definite acute hemorrhage or mass effect. No other restricted diffusion identified. Mild T2 shine through in the pons. But extensive signal abnormality most suggestive of chronic ischemia in the bilateral deep white matter, bilateral deep gray matter nuclei and pons. Several superimposed small chronic infarcts in the cerebellum, more so the left. Associated Wallerian degeneration at the left midbrain. Mild if any associated chronic blood products in these areas. Mild ex vacuo ventricular enlargement. Additional moderate nonspecific scattered subcortical white matter T2 and FLAIR hyperintensity in both hemispheres. The corpus callosum is relatively spared, although there is some  involvement of the splenium. No midline shift,  mass effect, evidence of mass lesion, ventriculomegaly, extra-axial collection or acute intracranial hemorrhage. Cervicomedullary junction and pituitary are within normal limits. Vascular: Major intracranial vascular flow voids are preserved. Skull and upper cervical spine: Negative visible cervical spine. Visualized bone marrow signal is within normal limits. Sinuses/Orbits: Negative. Other: Mastoids are clear. Visible internal auditory structures appear normal. Trace retained secretions in the nasopharynx. IMPRESSION: 1. 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 Underlying chronic blood products but no acute hemorrhage or mass effect. 2. Superimposed severe signal changes affecting the bilateral cerebral white matter, bilateral deep gray matter, and pons most suggestive of advanced small vessel ischemic disease. Relative sparing of the corpus callosum. 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 Several small chronic infarcts also in the cerebellum. Electronically Signed   By: Odessa Fleming M.D.   On: 04/13/2020 23:07   US Carotid Bilateral (at [facility] and [facility] only)  Result Date: 04/14/2020 CLINICAL DATA:  TIA symptoms, tobacco use EXAM: BILATERAL CAROTID DUPLEX ULTRASOUND TECHNIQUE: Wallace Cullens scale imaging, color Doppler and duplex ultrasound were performed of bilateral carotid and vertebral arteries in the neck. COMPARISON:  04/14/2020 CTA neck FINDINGS: Criteria: Quantification of carotid stenosis is based on velocity parameters that correlate the residual internal carotid diameter with NASCET-based stenosis levels, using the diameter of the distal internal carotid lumen as the denominator for stenosis measurement. The following velocity measurements were obtained: RIGHT ICA: 80/32 cm/sec CCA: 71/18 cm/sec SYSTOLIC ICA/CCA RATIO:  1.1 ECA: 76 cm/sec LEFT ICA: 66/25 cm/sec CCA: 77/13 cm/sec SYSTOLIC ICA/CCA RATIO:  0.9 ECA: 61 cm/sec RIGHT CAROTID  ARTERY: Minor intimal thickening without significant atherosclerosis or plaque formation. No hemodynamically significant right ICA stenosis, velocity elevation, turbulent flow. Degree of narrowing less than 50% by ultrasound criteria. RIGHT VERTEBRAL ARTERY:  Normal antegrade flow LEFT CAROTID ARTERY: Similar intimal thickening without significant atherosclerosis. No hemodynamically significant left ICA stenosis, velocity elevation, turbulent flow. Degree of narrowing also less than 50% by ultrasound criteria. LEFT VERTEBRAL ARTERY:  Normal antegrade flow IMPRESSION: Minor carotid intimal thickening without significant atherosclerotic change or calcification. No hemodynamically significant ICA stenosis. Degree of narrowing less than 50% bilaterally by ultrasound criteria. Patent antegrade vertebral flow bilaterally Electronically Signed   By: Judie Petit.  Shick M.D.   On: 04/14/2020 15:39   ECHOCARDIOGRAM COMPLETE  Result Date: 04/14/2020    ECHOCARDIOGRAM REPORT   Patient Name:   Rebekah Davenport Date of Exam: 04/14/2020 Medical Rec #:  [id number]        Height:       65.0 in Accession #:    [id number]       Weight:       274.9 lb Date of Birth:  07/12/1965        BSA:          2.265 m Patient Age:    54 years         BP:           139/67 mmHg Patient Gender: F                HR:           57 bpm. Exam Location:  [facility] Procedure: 2D Echo, Color Doppler and Cardiac Doppler Indications:    R01.1 Murmur  History:        Patient has no prior history of Echocardiogram examinations.                 COPD.  Sonographer:    Humphrey Rolls RDCS (  AE) Referring Phys: [id number] ANASTASSIA DOUTOVA Diagnosing      Yvonne Kendall MD Phys:  Sonographer Comments: Suboptimal subcostal window. Image acquisition challenging due to uncooperative patient. IMPRESSIONS  1. Left ventricular ejection fraction, by estimation, is 60 to 65%. The left ventricle has normal function. The left ventricle has no regional wall motion abnormalities. Left ventricular  diastolic parameters are consistent with Grade II diastolic dysfunction (pseudonormalization). Elevated left atrial pressure.  2. Right ventricular systolic function is normal. The right ventricular size is normal.  3. The mitral valve is normal in structure. Trivial mitral valve regurgitation. No evidence of mitral stenosis.  4. The aortic valve is tricuspid. Aortic valve regurgitation is not visualized. No aortic stenosis is present.  5. The inferior vena cava is normal in size with greater than 50% respiratory variability, suggesting right atrial pressure of 3 mmHg. FINDINGS  Left Ventricle: Left ventricular ejection fraction, by estimation, is 60 to 65%. The left ventricle has normal function. The left ventricle has no regional wall motion abnormalities. The left ventricular internal cavity size was normal in size. There is  borderline left ventricular hypertrophy. Left ventricular diastolic parameters are consistent with Grade II diastolic dysfunction (pseudonormalization). Elevated left atrial pressure. Right Ventricle: The right ventricular size is normal. No increase in right ventricular wall thickness. Right ventricular systolic function is normal. Left Atrium: Left atrial size was normal in size. Right Atrium: Right atrial size was normal in size. Pericardium: Trivial pericardial effusion is present. Mitral Valve: The mitral valve is normal in structure. Trivial mitral valve regurgitation. No evidence of mitral valve stenosis. MV peak gradient, 4.5 mmHg. The mean mitral valve gradient is 2.0 mmHg. Tricuspid Valve: The tricuspid valve is not well visualized. Tricuspid valve regurgitation is not demonstrated. Aortic Valve: The aortic valve is tricuspid. Aortic valve regurgitation is not visualized. No aortic stenosis is present. Aortic valve mean gradient measures 5.0 mmHg. Aortic valve peak gradient measures 11.0 mmHg. Aortic valve area, by VTI measures 2.32  cm. Pulmonic Valve: The pulmonic valve was  normal in structure. Pulmonic valve regurgitation is trivial. No evidence of pulmonic stenosis. Aorta: The aortic root is normal in size and structure. Pulmonary Artery: The pulmonary artery is of normal size. Venous: The inferior vena cava is normal in size with greater than 50% respiratory variability, suggesting right atrial pressure of 3 mmHg. IAS/Shunts: The interatrial septum was not well visualized.  LEFT VENTRICLE PLAX 2D LVIDd:         4.51 cm  Diastology LVIDs:         2.72 cm  LV e' medial:    5.33 cm/s LV PW:         1.04 cm  LV E/e' medial:  18.9 LV IVS:        0.92 cm  LV e' lateral:   7.83 cm/s LVOT diam:     1.90 cm  LV E/e' lateral: 12.9 LV SV:         84 LV SV Index:   37 LVOT Area:     2.84 cm  RIGHT VENTRICLE RV Basal diam:  3.48 cm LEFT ATRIUM             Index       RIGHT ATRIUM           Index LA diam:        3.20 cm 1.41 cm/m  RA Area:     12.40 cm LA Vol (A2C):   41.5 ml 18.33 ml/m RA Volume:  25.80 ml  11.39 ml/m LA Vol (A4C):   67.1 ml 29.63 ml/m LA Biplane Vol: 57.0 ml 25.17 ml/m  AORTIC VALVE                   PULMONIC VALVE AV Area (Vmax):    2.27 cm    PV Vmax:       1.29 m/s AV Area (Vmean):   2.36 cm    PV Vmean:      76.100 cm/s AV Area (VTI):     2.32 cm    PV VTI:        0.264 m AV Vmax:           166.00 cm/s PV Peak grad:  6.7 mmHg AV Vmean:          99.800 cm/s PV Mean grad:  3.0 mmHg AV VTI:            0.361 m AV Peak Grad:      11.0 mmHg AV Mean Grad:      5.0 mmHg LVOT Vmax:         133.00 cm/s LVOT Vmean:        83.200 cm/s LVOT VTI:          0.295 m LVOT/AV VTI ratio: 0.82  AORTA Ao Root diam: 3.40 cm MITRAL VALVE MV Area (PHT): 2.56 cm     SHUNTS MV Peak grad:  4.5 mmHg     Systemic VTI:  0.30 m MV Mean grad:  2.0 mmHg     Systemic Diam: 1.90 cm MV Vmax:       1.06 m/s MV Vmean:      55.7 cm/s MV Decel Time: 296 msec MV E velocity: 101.00 cm/s MV A velocity: 79.50 cm/s MV E/A ratio:  1.27 Cristal Deer End MD Electronically signed by Yvonne Kendall MD Signature  Date/Time: 04/14/2020/1:01:22 PM    Final     Microbiology: Recent Results (from the past 240 hour(s))  Respiratory Panel by RT PCR (Flu A&B, Covid) - Nasopharyngeal Swab     Status: None   Collection Time: 04/13/20 11:11 PM   Specimen: Nasopharyngeal Swab  Result Value Ref Range Status   SARS Coronavirus 2 by RT PCR NEGATIVE NEGATIVE Final    Comment: (NOTE) SARS-CoV-2 target nucleic acids are NOT DETECTED.  The SARS-CoV-2 RNA is generally detectable in upper respiratoy specimens during the acute phase of infection. The lowest concentration of SARS-CoV-2 viral copies this assay can detect is 131 copies/mL. A negative result does not preclude SARS-Cov-2 infection and should not be used as the sole basis for treatment or other patient management decisions. A negative result may occur with  improper specimen collection/handling, submission of specimen other than nasopharyngeal swab, presence of viral mutation(s) within the areas targeted by this assay, and inadequate number of viral copies (<131 copies/mL). A negative result must be combined with clinical observations, patient history, and epidemiological information. The expected result is Negative.  Fact Sheet for Patients:  https://www.moore.com/  Fact Sheet for Healthcare Providers:  https://www.young.biz/  This test is no t yet approved or cleared by the [place] FDA and  has been authorized for detection and/or diagnosis of SARS-CoV-2 by FDA under an Emergency Use Authorization (EUA). This EUA will remain  in effect (meaning this test can be used) for the duration of the COVID-19 declaration under Section 564(b)(1) of the Act, 21 U.S.C. section 360bbb-3(b)(1), unless the authorization is terminated or revoked sooner.     Influenza A  by PCR NEGATIVE NEGATIVE Final   Influenza B by PCR NEGATIVE NEGATIVE Final    Comment: (NOTE) The Xpert Xpress SARS-CoV-2/FLU/RSV assay is  intended as an aid in  the diagnosis of influenza from Nasopharyngeal swab specimens and  should not be used as a sole basis for treatment. Nasal washings and  aspirates are unacceptable for Xpert Xpress SARS-CoV-2/FLU/RSV  testing.  Fact Sheet for Patients: https://www.moore.com/  Fact Sheet for Healthcare Providers: https://www.young.biz/  This test is not yet approved or cleared by the [place] FDA and  has been authorized for detection and/or diagnosis of SARS-CoV-2 by  FDA under an Emergency Use Authorization (EUA). This EUA will remain  in effect (meaning this test can be used) for the duration of the  Covid-19 declaration under Section 564(b)(1) of the Act, 21  U.S.C. section 360bbb-3(b)(1), unless the authorization is  terminated or revoked. Performed at [facility], [address] [place], [place] [address]      Labs: CBC: Recent Labs  Lab 04/13/20 1635 04/13/20 1642 04/14/20 0533 04/15/20 0547  WBC 8.5 8.6 8.2 6.7  NEUTROABS  --  4.8 4.0  --   HGB 15.2* 15.4* 14.9 14.3  HCT 44.5 45.5 43.3 42.3  MCV 88.1 88.7 88.0 89.1  PLT 272 281 254 243   Basic Metabolic Panel: Recent Labs  Lab 04/13/20 1635 04/13/20 1642 04/14/20 0533 04/15/20 0547  NA 139  --  142 140  K 3.6  --  3.7 3.7  CL 106  --  110 109  CO2 26  --  22 23  GLUCOSE 106*  --  99 95  BUN 12  --  11 10  CREATININE 0.98  --  0.71 0.47  CALCIUM 8.9  --  8.8* 8.7*  MG  --  2.2 2.2  --   PHOS  --   --  3.5  --    Liver Function Tests: Recent Labs  Lab 04/13/20 1635 04/14/20 0533  AST 20 20  ALT 14 14  ALKPHOS 58 51  BILITOT 0.6 0.6  PROT 7.4 6.7  ALBUMIN 3.9 3.5   CBG: No results for input(s): GLUCAP in the last 168 hours.  Time spent: 35 minutes  Signed:  Lynden Oxford  [facility] 04/15/2020 10:45 PM

## 2024-01-18 NOTE — Progress Notes (Signed)
 Subjective  Chief Complaint  Patient presents with  . Annual Exam    HPI: History of Present Illness The patient is a 58 year old female who presents today for an annual preventative evaluation as well as to follow up on chronic conditions. Unfortunately, her sister is not with her today to supplement history, and she remains a very difficult reticent historian. It sounds like she has been out of all of her medicines except for her gabapentin for about a week or so, but otherwise had been getting them filled and taking them regularly prior to this. She is currently living in an assisted living facility and does need her FL-2 form updated for this, which was completed today. At her last visit, we had discussed getting set up for colonoscopy, mammogram, and Pap, which still need to be completed. She remains quite sedentary on a day-to-day basis. Feels like she eats a really good diet. She has no new complaints at this time otherwise.    Constitutional:  No fever or chills, no fatigue Occular: No eye pain, no vision changes HEENT: No sore throat, no sinus congestion Neck: No difficulty swallowing, no neck pain, no swelling Pulmonary: No shortness of breath, no cough Cardiovascular: No chest pain, no exertional dyspnea, no edema Gastrointestinal: No abdominal pain, no nausea Musculoskeletal: No joint pain, no muscle pain Neurologic: No numbness, weakness or tingling Skin: No rash, no wounds Psychiatric: No depression or anxiety symptoms    There is no immunization history on file for this patient.  Tobacco Use: Medium Risk (01/18/2024)   Patient History   . Smoking Tobacco Use: Former   . Smokeless Tobacco Use: Never   . Passive Exposure: Not on file   Alcohol Use: Not At Risk (01/18/2024)   AUDIT-C   . Frequency of Alcohol Consumption: Never   . Average Number of Drinks: Patient does not drink   . Frequency of Binge Drinking: Never   Depression: Not on file     Objective Vitals:   01/18/24 1320  BP: (!) 142/98  Pulse: 71  SpO2: 94%    General:  Well-appearing female patient in no acute distress, nontoxic in appearance  HEENT:  Normocephalic, atraumatic, sclera anicteric, mucous membranes moist and pink  Neck:  Supple, no lymphadenopathy, no thyromegaly  Pulmonary:  Nonlabored respirations, speaking full sentences, clear to auscultation bilaterally  Cardiovascular:  Normal rate and regular rhythm, no gallop murmur rub, extremities are warm and well perfused and without edema  Abdomen:  Soft, nontender, nondistended, active bowel tones, no masses or organomegaly  Neurologic:  Awake, alert oriented, no dysarthria, no focal motor or sensory deficits appreciated  Musculoskeletal:  No injury or deformity appreciated  Psych:  Pleasant and cooperative with history and examination    Assessment & Plan Problem List Items Addressed This Visit       Circulatory   Chronic diastolic heart failure (HCC)   Overview  Grade 2 DD on most recent echocardiogram. Currently appears euvolemic on exam      Paroxysmal atrial fibrillation (HCC)   Overview  On Eliquis 5 mg b.i.d. for stroke risk reduction      Current Assessment & Plan  No adverse effects from medications, at treatment goal. Continue with current dosing and refill as needed.         Digestive   Obesity, morbid (HCC)     Endocrine/Metabolic   Mixed hyperlipidemia   Overview  On atorvastatin  80mg  daily      Current Assessment & Plan  No adverse effects from medications, at treatment goal. Continue with current dosing and refill as needed.         Immune   Systemic lupus erythematosus, unspecified SLE type, unspecified organ involvement status (HCC) - Primary   Overview  Unclear history        Other   History of CVA (cerebrovascular accident)   Overview  Patient with multiple prior strokes, given her difficulty in supplying history I do wonder if she may  have some element of vascular dementia.      Cigarette nicotine dependence without complication   Overview  > 3 to 10 minutes spent with patient counseling regarding the following:  1. Asked about tobacco/smoking/nicotine use  2. Advised to quit  3. Assessed for the willingness to attempt to quit  4. Assisted with the attempt to quit  5. Follow-up with the patient was arranged as appropriate.   Remains precontemplative      Other Visit Diagnoses       Atrial fibrillation, unspecified type (HCC)         Encounter for screening mammogram for malignant neoplasm of breast       Relevant Orders   BI Mammogram Screening W Tomosynthesis Bilateral     Screening for malignant neoplasm of colon       Relevant Orders   Ambulatory referral to Gastroenterology      Advised to monitor BP at home and maintain log given in office elevation today to determine need for instituting antihypertensive therapy.  DAX copilot utilized for dictation purposes only, no patient voice capture technology utilized with today's visit.

## 2024-01-19 NOTE — Assessment & Plan Note (Signed)
 No adverse effects from medications, at treatment goal. Continue with current dosing and refill as needed.

## 2024-02-02 NOTE — Telephone Encounter (Signed)
 Shon (patient's sister) dropped off the FL2 form with corrections made that are needed by the facility.  Please call Shon when it is ready to be picked up.  Thank you,  Loretta

## 2024-02-09 NOTE — Progress Notes (Signed)
 PPD Skin test read by Lacinda Lamp, CCMA and also Artist Molt MD. Patient presented for read with an inflamed 10mm induration. Result reported to [facility] Department and Follow up testing ordered by provider

## 2024-02-12 NOTE — Telephone Encounter (Signed)
 PCP: Joshua Kato, nurse with [facility] Department, states calling about positive TB diagnosis. Kato states patient's sister states chest x-ray and IGRA blood test have already been completed. Kato requesting reports for both be faxed to them at [phone]. Can be reached at number above.

## 2024-02-15 NOTE — Telephone Encounter (Signed)
 Faxed

## 2024-02-22 NOTE — Telephone Encounter (Signed)
 Final follow up to close loop. Spoke with sister, patient was given option to be treated by [facility] or by health dept in [place] where she will reside from now on. As of 8/28 patient's care was being taken care of [place] officially [place].

## 2024-02-29 ENCOUNTER — Telehealth: Payer: Self-pay

## 2024-02-29 NOTE — Telephone Encounter (Signed)
 Patient referred from [facility] to [facility]. TB clinic for LTBI treatment.  Positive QFT 02/09/2024, Normal Chest X-ray 02/09/2024.  Reported as a current resident of [address] [facility] in [place], [place].  Phone call to [place] Shoulder, Patient's legal guardian, no answer, unable to leave voice message. TB clinic nurse will call at later time. Almarie Metro, RN

## 2024-03-04 ENCOUNTER — Telehealth: Payer: Self-pay

## 2024-03-04 ENCOUNTER — Ambulatory Visit

## 2024-03-04 NOTE — Progress Notes (Signed)
 The patient is a 58 year old female screened for TB with QFT on 02/06/24 which was positive. Follow-up CXR on 02/09/24 was normal (see Care Everywhere).  EPI completed today 03/04/24 with sister Lashe Oliveira. Pt has lived with sister for the past few years until last month, when she moved into an assisted living facility. Before that, she lived with her father for many years, and before that she was incarcerated off and on.   Per her medical record, pt has a history of multiple strokes and likely has vascular dementia. She is a poor historian and relies upon sister to provide collateral history at medical appointments. Per sister, the pt has not had any signs or symptoms of TB except for shortness of breath. She is short of breath with walking up a few steps. Sister attributes this to pt's heart failure, asthma, and weight (274 lb). Pt has not been demonstrating/complaining of any other signs or symptoms of TB. 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 We will also need to develop a plan of care with the facility to ensure direct observed therapy given that she lives in a facility and has cognitive impairment.  Will follow up with patient and sister pending physician review and development of plan of care.  Delon LITTIE Primrose, RN

## 2024-03-04 NOTE — Telephone Encounter (Signed)
 Attempted to reach pt's sister and legal guardian to complete Epi and discuss LTBI tx. No answer, left voicemail w/ callback number.  Delon LITTIE Primrose, RN

## 2024-03-06 ENCOUNTER — Telehealth: Payer: Self-pay

## 2024-03-06 NOTE — Telephone Encounter (Signed)
 LTBI treatment recommended by Dr. Herlene, Rifabutin 300 mg daily x 4 months. Phone call to Patient's sister at [phone], voice message left asking her to contact TB clinic nurse at [phone].   Also, phone call to [facility] assisted living facility at [phone], I spoke with Cherylle nurse at the facility regarding the possibility of DOT, she confirmed that all medications given to their residents are provided by [facility] in [place], and administered to their clients by observed therapy.   Almarie Metro, RN

## 2024-03-08 ENCOUNTER — Telehealth: Payer: Self-pay

## 2024-03-08 NOTE — Telephone Encounter (Signed)
 LTBI treatment pending. Phone call to patient's sister, voice message left asking her to contact TB clinic nurse at [phone]. Almarie Metro, RN

## 2024-03-11 ENCOUNTER — Telehealth: Payer: Self-pay

## 2024-03-11 NOTE — Telephone Encounter (Signed)
 Attempted to reach pt to discuss LTBI tx.  Rebekah LITTIE Primrose, RN

## 2024-03-11 NOTE — Telephone Encounter (Signed)
 Spoke with Jeoffrey Paras, nurse for pt's provider at [facility]. [facility] had initiated TB testing and relayed positive PPD to [facility]. Amber left a voicemail to follow up on pt's testing. Relayed that pt's CXR was negative so no need for isolation, diagosis of LTBI. Need to discuss LTBI tx options with pt and then may possibly proceed with LTBI tx via DOT tx.   Amber advised that the person to coordinate tx with would be Sherlean, who is in charge at [facility] at [phone].

## 2024-03-12 ENCOUNTER — Telehealth: Payer: Self-pay

## 2024-03-12 NOTE — Telephone Encounter (Signed)
 Per [facility] health department TB provider, latent TB treatment recommended, Rifabutin 300 mg daily x 4 months. Agreement for treatment by patient or guardian, as well as DOT coordination pending. Phone call to [facility] assisted living facility, voice message left for [place] at [phone] requesting him to contact TB clinic nurse at [phone]. Almarie Metro, RN

## 2024-03-18 ENCOUNTER — Telehealth: Payer: Self-pay

## 2024-03-18 NOTE — Telephone Encounter (Signed)
 Attempted to reach Christian at [facility] to coordinate LTBI tx. No answer.  Delon LITTIE Primrose, RN

## 2024-03-25 DIAGNOSIS — Z227 Latent tuberculosis: Secondary | ICD-10-CM | POA: Insufficient documentation

## 2024-03-26 ENCOUNTER — Telehealth: Payer: Self-pay

## 2024-03-26 NOTE — Telephone Encounter (Signed)
 Patient and sister, Kirk[place], interested in Latent TB treatment.  Patient is a new resident at [facility].    Spoke with [employer], Sherlean, today regarding coordination of follow up care for latent TB.     Latent Treatment can be managed through the facility's contracted provider and the patient's care manager.   Patient's Care Manager is [place], [phone].   Patient's Care Provider is Randine Hacker, Nurse Practitioner, with [facility].

## 2024-04-08 NOTE — Telephone Encounter (Signed)
 Left message for Care Manager, Rebekah Davenport, at the [facility] to return call to discuss LTBI follow up care.

## 2024-04-12 NOTE — Telephone Encounter (Signed)
 Left message for Facility Care Manager, Rebekah Davenport regarding LTBI follow up.

## 2024-07-03 NOTE — Telephone Encounter (Signed)
 Multiple messages left for care manager at [facility] with no response.  Phone call to Sherlean, Production Designer, Theatre[employer]television/film of the facility, requested a call back to coordinate care.

## 2024-07-11 NOTE — Telephone Encounter (Signed)
 Spoke with [place], Futures Trader at [facility].  Randine Hacker, Nurse Practitioner contact information: [phone] [email or web address]   Left message with Randine Hacker, NP.
